# Patient Record
Sex: Male | Born: 1935 | Race: White | Hispanic: No | Marital: Married | Smoking: Never smoker
Health system: Southern US, Community
[De-identification: ages and names within clinical notes are randomized; demographics above are authoritative.]

## PROBLEM LIST (undated history)

## (undated) DIAGNOSIS — F0390 Unspecified dementia without behavioral disturbance: Secondary | ICD-10-CM

## (undated) DIAGNOSIS — E669 Obesity, unspecified: Secondary | ICD-10-CM

## (undated) DIAGNOSIS — E119 Type 2 diabetes mellitus without complications: Secondary | ICD-10-CM

## (undated) DIAGNOSIS — N400 Enlarged prostate without lower urinary tract symptoms: Secondary | ICD-10-CM

## (undated) DIAGNOSIS — E785 Hyperlipidemia, unspecified: Secondary | ICD-10-CM

## (undated) DIAGNOSIS — I10 Essential (primary) hypertension: Secondary | ICD-10-CM

## (undated) HISTORY — PX: CARPAL TUNNEL RELEASE: SHX101

## (undated) HISTORY — DX: Hyperlipidemia, unspecified: E78.5

## (undated) HISTORY — DX: Essential (primary) hypertension: I10

## (undated) HISTORY — PX: TONSILLECTOMY: SUR1361

## (undated) HISTORY — PX: CATARACT EXTRACTION: SUR2

---

## 2001-01-25 ENCOUNTER — Encounter: Payer: Self-pay | Admitting: Orthopedic Surgery

## 2001-01-27 ENCOUNTER — Inpatient Hospital Stay (HOSPITAL_COMMUNITY): Admission: RE | Admit: 2001-01-27 | Discharge: 2001-02-01 | Payer: Self-pay | Admitting: Orthopedic Surgery

## 2001-01-28 ENCOUNTER — Encounter: Payer: Self-pay | Admitting: Orthopedic Surgery

## 2001-01-29 ENCOUNTER — Encounter: Payer: Self-pay | Admitting: Orthopedic Surgery

## 2002-08-25 ENCOUNTER — Ambulatory Visit (HOSPITAL_COMMUNITY): Admission: RE | Admit: 2002-08-25 | Discharge: 2002-08-25 | Payer: Self-pay | Admitting: Gastroenterology

## 2003-03-18 HISTORY — PX: TOTAL KNEE ARTHROPLASTY: SHX125

## 2004-05-20 ENCOUNTER — Encounter: Admission: RE | Admit: 2004-05-20 | Discharge: 2004-08-18 | Payer: Self-pay | Admitting: Family Medicine

## 2006-03-20 ENCOUNTER — Ambulatory Visit (HOSPITAL_BASED_OUTPATIENT_CLINIC_OR_DEPARTMENT_OTHER): Admission: RE | Admit: 2006-03-20 | Discharge: 2006-03-20 | Payer: Self-pay | Admitting: Orthopedic Surgery

## 2009-06-15 ENCOUNTER — Ambulatory Visit (HOSPITAL_BASED_OUTPATIENT_CLINIC_OR_DEPARTMENT_OTHER): Admission: RE | Admit: 2009-06-15 | Discharge: 2009-06-15 | Payer: Self-pay | Admitting: Orthopedic Surgery

## 2009-07-19 ENCOUNTER — Inpatient Hospital Stay (HOSPITAL_COMMUNITY): Admission: EM | Admit: 2009-07-19 | Discharge: 2009-07-20 | Payer: Self-pay | Admitting: Emergency Medicine

## 2009-07-19 ENCOUNTER — Ambulatory Visit: Payer: Self-pay | Admitting: Cardiology

## 2009-07-20 ENCOUNTER — Ambulatory Visit: Payer: Self-pay | Admitting: Vascular Surgery

## 2009-07-20 ENCOUNTER — Encounter (INDEPENDENT_AMBULATORY_CARE_PROVIDER_SITE_OTHER): Payer: Self-pay | Admitting: Internal Medicine

## 2010-06-04 LAB — BRAIN NATRIURETIC PEPTIDE: Pro B Natriuretic peptide (BNP): <30 pg/mL (ref 0.0–100.0)

## 2010-06-04 LAB — CBC
MCHC: 32.8 g/dL (ref 30.0–36.0)
MCHC: 33 g/dL (ref 30.0–36.0)
MCV: 90.1 fL (ref 78.0–100.0)
Platelets: 244 10*3/uL (ref 150–400)
Platelets: 258 10*3/uL (ref 150–400)
RBC: 4.45 MIL/uL (ref 4.22–5.81)
RDW: 14.3 % (ref 11.5–15.5)
RDW: 15 % (ref 11.5–15.5)
WBC: 10.4 10*3/uL (ref 4.0–10.5)

## 2010-06-04 LAB — DIFFERENTIAL
Basophils Relative: 0 % (ref 0–1)
Eosinophils Absolute: 0.1 10*3/uL (ref 0.0–0.7)
Eosinophils Relative: 1 % (ref 0–5)

## 2010-06-04 LAB — HEMOGLOBIN A1C: Hgb A1c MFr Bld: 6.2 % — ABNORMAL HIGH (ref <5.7)

## 2010-06-04 LAB — URINE CULTURE: Colony Count: 9000

## 2010-06-04 LAB — BASIC METABOLIC PANEL
BUN: 23 mg/dL (ref 6–23)
Calcium: 9.1 mg/dL (ref 8.4–10.5)
GFR calc non Af Amer: >60 mL/min (ref >60)
Potassium: 3.7 mEq/L (ref 3.5–5.1)
Sodium: 138 mEq/L (ref 135–145)

## 2010-06-04 LAB — URINALYSIS, MICROSCOPIC ONLY
Glucose, UA: NEGATIVE mg/dL
Ketones, ur: NEGATIVE mg/dL
Leukocytes, UA: NEGATIVE
Nitrite: NEGATIVE
Specific Gravity, Urine: 1.022 (ref 1.005–1.030)
Urobilinogen, UA: 1 mg/dL (ref 0.0–1.0)

## 2010-06-04 LAB — CK TOTAL AND CKMB (NOT AT ARMC)
CK, MB: 13.6 ng/mL (ref 0.3–4.0)
Relative Index: 4.5 — ABNORMAL HIGH (ref 0.0–2.5)
Total CK: 300 U/L — ABNORMAL HIGH (ref 7–232)

## 2010-06-04 LAB — COMPREHENSIVE METABOLIC PANEL
Alkaline Phosphatase: 57 U/L (ref 39–117)
CO2: 23 mEq/L (ref 19–32)
Chloride: 103 mEq/L (ref 96–112)
GFR calc non Af Amer: 51 mL/min — ABNORMAL LOW (ref >60)
Glucose, Bld: 140 mg/dL — ABNORMAL HIGH (ref 70–99)
Potassium: 4.3 mEq/L (ref 3.5–5.1)
Sodium: 137 mEq/L (ref 135–145)
Total Protein: 7.6 g/dL (ref 6.0–8.3)

## 2010-06-04 LAB — T4, FREE: Free T4: 1.16 ng/dL (ref 0.80–1.80)

## 2010-06-04 LAB — D-DIMER, QUANTITATIVE: D-Dimer, Quant: 0.68 ug/mL-FEU — ABNORMAL HIGH (ref 0.00–0.48)

## 2010-06-04 LAB — CARDIAC PANEL(CRET KIN+CKTOT+MB+TROPI)
Relative Index: 4 — ABNORMAL HIGH (ref 0.0–2.5)
Troponin I: 0.01 ng/mL (ref 0.00–0.06)
Troponin I: 0.02 ng/mL (ref 0.00–0.06)

## 2010-06-04 LAB — LIPID PANEL
Cholesterol: 159 mg/dL (ref 0–200)
LDL Cholesterol: 114 mg/dL — ABNORMAL HIGH (ref 0–99)
Triglycerides: 100 mg/dL (ref <150)

## 2010-06-04 LAB — PROTIME-INR
INR: 1.07 (ref 0.00–1.49)
Prothrombin Time: 13.8 seconds (ref 11.6–15.2)

## 2010-06-05 LAB — POCT HEMOGLOBIN-HEMACUE: Hemoglobin: 13.9 g/dL (ref 13.0–17.0)

## 2010-06-09 LAB — BASIC METABOLIC PANEL
CO2: 25 mEq/L (ref 19–32)
Calcium: 9 mg/dL (ref 8.4–10.5)
Chloride: 109 mEq/L (ref 96–112)
Creatinine, Ser: 0.91 mg/dL (ref 0.4–1.5)
Sodium: 140 mEq/L (ref 135–145)

## 2010-08-02 NOTE — Op Note (Signed)
Daryl Salas, Daryl Salas            ACCOUNT NO.:  [id number]   MEDICAL RECORD NO.:  [id number]          PATIENT TYPE:  AMB   LOCATION:  DSC                          FACILITY:  [facility]   PHYSICIAN:  Katy Fitch. Sypher, M.D. 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   OPERATION:  1. Avulsion of left thumb nail plate.  2. Incision and drainage of left nail wall and apical abscess with      excisional debridement of necrotic tissue.   SURGEON:  Katy Fitch. Sypher, M.D.   ASSISTANT:  None.   ANESTHESIA:  2% lidocaine metacarpal head level block, left thumb, no  supplemental sedation was provided.  This was performed in the minor  operating room at [facility].   INDICATIONS:  Smitty Ackerley is a 77 year old right hand dominant  gentleman referred through the courtesy of Dr. Henrine Screws for  evaluation and management of a left thumb necrotizing infection.  Mr.  Hulsebus was evaluated by Dr. Abigail Miyamoto at [facility] [place] and  was noted to have a necrotizing infection.  He was placed on oral  doxycycline and advised to seek a hand surgery consult.  Mr. Bonenberger  was seen on March 20, 2006.  At that time, he was noted to have a  necrotizing thumb pulp, nail fold and subungual infection.  He underwent  debridement of his wound in the office and was noted have a subungual  collection of purulent material.  He had noted that since beginning is  antibiotic, his pain had improved somewhat.  We advised Mr. Postema to  proceed with incision and drainage with nail plate removal and  excisional debridement of his necrotic tissues along the ulnar nail  wall.  He is brought to the minor  operating room at this time.   PROCEDURE:  Batu Cassin is brought to the operating room and placed  in the supine position on the operating table.  Following placement of a  2% lidocaine metacarpal head level block, adequate anesthesia was  achieved.  The left hand and thumb were prepped with Betadine soap  solution and sterilely draped.  When anesthesia was assured, we  proceeded without a tourniquet.  The radial 6 mm of nail plate was  resected after undermining with a Therapist, nutritional and incision with a  pair of tenotomy scissors.  The nail was avulsed from the ulnar nail  wall with recovery of purulent material from beneath the nail and along  the ulnar nail wall.  A microcuret was used to remove all necrotic  tissues.  The areas of epidermal lysis were unroofed.  A complete  excisional debridement of all devitalized tissue was accomplished.  The  thumb was then dressed with Xeroflow sterile gauze and Coban.   Mr. Geoghegan will begin Septra DS one p.o. b.i.d. x7 days and will  continue with his doxycycline 100 mg p.o. b.i.d.  He will also use  Tylenol with Codeine #3, 1-2 tablets p.o. q.4-6h. p.r.n. pain.  Mr.  Gilkeson will return to our office for follow up in three days for a  dressing change and wound examination.  He will contact us p.r.n. fever  or other problems with his medication.      Katy Fitch Sypher, M.D.  Electronically Signed     RVS/MEDQ  D:  03/20/2006  T:  03/20/2006  Job:  [id number]   cc:   Chales Salmon. Abigail Miyamoto, M.D.

## 2010-08-02 NOTE — Discharge Summary (Signed)
[place]  Patient:    Daryl Salas, Daryl Salas [facility] Visit Number: [id number] MRN: [id number]          Service Type: SUR Location: 5000 5027 [id number] Attending Physician:  Marlowe Kays Page Dictated by:   Dorie Rank, P.A. Admit Date:  01/27/2001 Discharge Date: 02/01/2001                             Discharge Summary  ADMISSION DIAGNOSES: 1. End-stage osteoarthritis to the left knee. 2. Hypertension.  DISCHARGE DIAGNOSES: 1. Osteoarthritis of the left knee, status post left total knee arthroplasty. 2. Hypertension. 3. Tachycardia while in the hospital, resolved prior to discharge.  PROCEDURE:  On January 27, 2001, the patient underwent a left total knee arthroplasty using the Osteon[employer] system to the left.  Surgeon Dr. Marlowe Kays, assistant Dr. Jene Every, anesthesia general.  No complications.  CONSULTATION:  Selina Cooley, M.D. of [facility].  HISTORY OF PRESENT ILLNESS:  This is a pleasant, 77 year old male who has had an ongoing history of left knee pain for quite some time now.  It was interfering with his activities of daily living and has failed conservative management such as NSAIDs and physical therapy.  Radiographs in the office showed severe, end-stage osteoarthritis.  It was felt he would benefit from undergoing a left total knee arthroplasty.  The risks and benefits as well as the procedure were discussed with the patient and he agreed to proceed.  HOSPITAL COURSE:  The patient underwent the above-stated surgery on January 27, 2001, and tolerated the procedure well.  While in the operating room, the incision was dressed in a sterile fashion.  It was clean, dry and intact on postop day #1.  This was changed on postop day #2 and daily thereafter and found to be free of any erythema or drainage.  Hemovac drain was placed while in the operating room and this was discontinued on postop day #1 without difficulty.  While  in the operating room, an IV was started as well as a Foley catheter was inserted.  The Foley was discontinued on postop day #2 and he voided well throughout the remainder of his hospital stay.  Postoperatively, the patient was placed on Coumadin for DVT prophylaxis and this was monitored through the pharmacy throughout the remainder of his hospital stay.  A regular diet was advanced as tolerated.  He had good p.o. intake as well as BMs while in the hospital.  IV Ancef was used for postop infection control.  His home medications were resumed with Norvasc, triamterene and hydrochlorothiazide. Initially, he utilized a PCA of morphine.  This was discontinued on postop day #2.  His pain was adequately controlled on p.o. Percocet throughout the remainder of his hospital stay.  He also utilized knee high TED hose for DVT prophylaxis while in the hospital.  He utilized a CPM machine and this was advanced as tolerated.  Physical therapy worked with the patient for ambulation and total joint protocol.  He was 50% partial weightbearing to the left lower extremity.  He progressed well and was able to maintain his weightbearing status.  Hemoglobin and hematocrit were checked daily x 3 days and found to be stable postoperatively.  While in the hospital on postop day #1, he did have some tachycardia.  An EKG revealed a rate of 131 and no ST changes.  He was having no chest pain and his lungs were clear to auscultation.  A consult was obtained.  Dr. Evonnie Pat saw and evaluated the patient.  Serial EKGs were obtained and showed no changes.  Chest x-ray revealed no active disease.  A CT scan of the chest was obtained to rule out PE and was negative.  It was felt that his tachycardia was multifactorial related to stress, fever and anemia.  He did run a mild fever in the hospital and utilized incentive spirometer and it was felt this was likely due to atelectasis.  On February 01, 2001, he was felt to be stable  for discharge from a medical and orthopedic standpoint.  LABORATORY DATA AND X-RAY FINDINGS:  H&H on January 25, 2001, 13.0 and 37.6. On January 29, 2001, hemoglobin 10.4 and hematocrit 30.0.  On November 16, hemoglobin 9.8 and hematocrit 28.5.  Blood gasses on January 28, 2001, revealed a pO2 of 63.1, bicarb 26.4, otherwise within normal limits.  PT/INR on January 25, 2001, within normal limits.  On January 31, 2001, on Coumadin therapy, 15.9 and 1.4 respectively.  On February 01, 2001, PT 15.6, INR 1.3. BMET on January 25, 2001, normal other than glucose 125.  On January 30, 2001, potassium 3.4, glucose 141, otherwise normal.  Liver function studies on January 25, 2001, revealed an ALT of 44, otherwise normal.  UA on November 11 and January 31, 2001, negative.  Blood type on January 25, 2001, O positive. Blood cultures from January 31, 2001, and on February 01, 2001, were fine and showed no growth x 1 day.  EKG tracings from January 28, 2001, revealed sinus tachycardia, otherwise normal confirmed by Dr. Nona Dell.  On January 29, 2001, tracings showed sinus tachycardia with no changes since November 14, confirmed by Dr. Verdis Prime.  On January 30, 2001, EKG showed sinus tachycardia with no changes confirmed by Dr. Valera Castle.  CT of chest on January 29, 2001, revealed right lower lobe atelectasis with right pleural effusion, superimposed infiltrate or even pulmonary infarct is not excluded.  However, no gross pulmonary emboli noted or cardiomegaly.  CT of the lower extremity, for evaluation of DVT on January 29, 2001, showed no evidence for deep venous thrombosis.  DISPOSITION:  The patient was discharged to home with home health PT and Coumadin therapy with [place].  ACTIVITY:  50% partial weightbearing to the left lower extremity.  SPECIAL INSTRUCTIONS:  Daily dressing changes.  Shower on postop day #5. Incentive spirometry at home.  DISCHARGE  MEDICATIONS: 1. Tylenol p.r.n. fever.  If greater than 101 call the office. 2. Percocet. 3. Robaxin. 4. Coumadin per pharmacy.  5. Resume home medications.  DIET:  Low sodium diet. Dictated by:   Dorie Rank, P.A. Attending Physician:  Joaquin Courts DD:  03/03/01 TD:  03/04/01 Job: [id number]

## 2010-08-02 NOTE — H&P (Signed)
[facility]  Patient:    Daryl Salas, Daryl Salas Visit Number: [id number] MRN: [id number]          Service Type: Attending:  Illene Labrador. Aplington, M.D. Dictated by:   Sammuel Cooper. Mahar, P.A. Adm. Date:  01/27/01                           History and Physical  DATE OF BIRTH:  03/14/1934  CHIEF COMPLAINT:  Left knee pain.  HISTORY OF PRESENT ILLNESS:  The patient has a longstanding history of left knee pain and disability.  He has failed conservative management, such as NSAIDs, rest, and physical therapy to the point that it is causing him daily pain and effecting his activities of daily living and quality of life.  It was felt that his best course of management at this point would be to proceed with a left total knee replacement.  PAST MEDICAL HISTORY:  Significant for hypertension, which is medically controlled.  PAST SURGICAL HISTORY:  Tonsillectomy as a child and bilateral cataract surgery in 2000.  ALLERGIES:  No known drug allergies.  MEDICATIONS: 1. Norvasc 10 mg q.d. 2. Triamterene/hydrochlorothiazide 37.5/25 mg one tablet p.o. q.d.  FAMILY MEDICAL HISTORY:  Mother deceased at age 80 with a history of diabetes. Father deceased at age 56 with colon cancer and arthritis.  REVIEW OF SYSTEMS:  The patient denies any fever, chills, night sweats, or bleeding tendencies.  CNS:  Denies blurred vision, double vision, seizure, headache, or paralysis.  Pulmonary:  Denies shortness of breath, productive cough, and hemoptysis.  Cardiovascular:  Denies chest pain, angina, orthopnea, or claudication.  GI:  Denies nausea, vomiting, constipation, diarrhea, melena, or bloody stool.  GU:  Denies dysuria, hematuria, or discharge. Musculoskeletal:  Denies paresthesias, numbness, or weakness in his extremities.  PHYSICAL EXAMINATION:  VITAL SIGNS:  The blood pressure is 150/68, respirations are 20 and unlabored, and the pulse if 92 and regular.  GENERAL  APPEARANCE:  The patient is a 77 year old white male who is alert and oriented and in no acute distress.  He is very pleasant and cooperative to examination.  He is well nourished and well groomed.  He walks with an antalgic gait.  HEENT:  Pupils are equal, round, and reactive to light.  The nares are patent bilaterally.  The pharynx is clear without any erythema or exudate.  NECK:  Soft and supple to palpation.  No bruits appreciated.  No thyromegaly noted.  No adenopathy.  CHEST:  Clear to auscultation bilaterally.  No rales, rhonchi, stridor, wheezes, or friction rubs.  BREASTS:  Not pertinent and not performed.  HEART:  S1 and S2 regular rate and rhythm.  No murmurs, rubs, or gallops noted.  ABDOMEN:  Positive bowel sounds throughout.  An obese abdomen, nontender and nondistended, with no organomegaly noted.  GENITOURINARY:  Not pertinent and not performed.  EXTREMITIES:  Knees with varus deformity bilaterally.  He is tender throughout medial and lateral joint lines on his left knee.  He does have varicose veins in his left lower extremity.  SKIN:  Intact without any lesions or rashes.  LABORATORY DATA AND X-RAYS:  There is bone-on-bone deformity on the left knee.  IMPRESSION: 1. End-stage osteoarthritis, left knee. 2. Hypertension.  PLAN:  Admit to [facility] [facility] [employer] [facility] on January 27, 2001, for a left total knee replacement by Illene Labrador. Aplington, M.D.  The patient has obtained preoperative medical clearance from [facility].  His primary care practitioner is Vikki Ports, M.D., and has given him clearance to proceed with his knee replacement surgery. Dictated by:   Sammuel Cooper. Mahar, P.A. Attending:  Illene Labrador. Aplington, M.D. DD:  01/18/01 TD:  01/19/01 Job: [id number]

## 2010-08-02 NOTE — Op Note (Signed)
   NAMEFOSTER, FRERICKS                        ACCOUNT NO.:  [id number]   MEDICAL RECORD NO.:  [id number]                   PATIENT TYPE:  AMB   LOCATION:  ENDO                                 FACILITY:  [facility]   PHYSICIAN:  Graylin Shiver, M.D.                DATE OF BIRTH:  Jan 25, 1934   DATE OF PROCEDURE:  08/25/2002  DATE OF DISCHARGE:                                 OPERATIVE REPORT   PROCEDURE:  Colonoscopy.   INDICATION FOR PROCEDURE:  Screening.   Informed consent was obtained.   PREMEDICATION:  Fentanyl 60 mcg IV, Versed 6 mg IV.   PROCEDURE:  With the patient in the left lateral decubitus position, a  rectal exam was performed and no masses were felt.  The Olympus colonoscope  was inserted into the rectum and advanced around the colon to the cecum.  Cecal landmarks were identified.  The cecum and ascending colon were normal.  The transverse colon was normal.  The descending colon, sigmoid, and rectum  were normal.  He tolerated the procedure well without complications.   IMPRESSION:  Normal colonoscopy to the cecum.                                               Graylin Shiver, M.D.    Germain Osgood  D:  08/25/2002  T:  08/25/2002  Job:  [id number]   cc:   Vikki Ports, M.D.  [address]  [place]  [place] [address]  Fax: [phone]

## 2010-08-02 NOTE — Op Note (Signed)
[place]  Patient:    Daryl Salas [facility] Visit Number: [id number] MRN: [id number]          Service Type: SUR Location: 5000 5027 01 Attending Physician:  Marlowe Kays Page Dictated by:   Illene Labrador. Aplington, M.D. Proc. Date: 01/27/01 Admit Date:  01/27/2001                             Operative Report  PREOPERATIVE DIAGNOSIS:  Osteoarthritis - left knee.  POSTOPERATIVE DIAGNOSIS:  Osteoarthritis - left knee.  OPERATION:  Osteonics total knee replacement - left.  SURGEON:  Illene Labrador. Aplington, M.D.  ASSISTANT:  Javier Docker, M.D.  ANESTHESIA:  General.  JUSTIFICATION FOR PROCEDURE:  He had significant pain and tricompartment arthritis with bone-on-bone abutment medially.  PROCEDURE:  Prophylactic antibiotics, satisfactory general anesthesia, Foley catheter inserted, left hip stabilizer, pneumatic tourniquet, and Surefoot applied.  The left leg was prepped with Duraprep from tourniquet to ankle and draped in a sterile field, Ioban employed.  A vertical midline incision down to the patellar mechanism with median peripatellar incision to open the joint. Large osteophytes from around the femur and the patella were removed with rongeur.  The patella was sized to a #30.  I freed up the patellar mechanism and also undermined the mediocollateral ligament and ______ off the proximal tibia.  The patella was then everted and the knee flexed.  The anterior portions of both menisci and the ACL were sacrificed and a 5/16th inch drill hole was made in the distal femur, followed by the canal finder and the axis aligning rod with the distal femoral jig placed set at 5 degrees for the left knee.  He had a small flexion contracture and accordingly, I elected to go ahead with a 12 mm distal femoral cut and this was made.  I then removed remnants of the menisci posteriorly and used the guide for the distal femur to measure for the distal femoral  prosthesis, which was found to be an eleven. The position lines for the distal femoral cutting jig were also made.  The chamfering distal femoral cutting jig was then applied and anterior and posterior cuts and posterior and anterior chamferings were then made.  Then, returned the the tibia, where an leveling cut was made and this was sized at a #11 likewise.  I elected to save the posterior cruciate ligament because it was in good condition and the knee was not so tight that we had to sacrifice it.  I then, using the tibial base plate, made an intramedullary drill hole, followed by the step-cut drill and the canal finder.  I then placed intramedullary rod with cutting jig set at 5 degrees for posterior cut and 4 mm cut from the ______ medial tibial plateau, aligning it with an extramedullary rod splitting the bimalleolar  distance.  My cut was made. Then, removed remnants of the posterior femur using a laminar spreader, osteotome and curved Crego.  The femoral guide for creating the patellar groove was then placed and the groove created.  I then went through a trial reduction and found the 10 mm spacer fit nicely with excellent medial and lateral stability and no lift-off, and zero to minimal recurvatum.  Using the external alignment rod once again I realigned up the scribe lines of the tibial base plate based on splitting the bimalleolar distance.  While the knee was in extension we then went ahead and  used the 10 cc mm recessed cutting jig to cut for the patella then inserted the 3-hole guide and made the three fixation holes.  The trial patella was then placed and excess bone removed from around the perimeter.  Then, returned to the tibia, where the tibia base plate #16 was placed with 3-pin fixation using the previously marked scribe lines and the tripod apparatus was then used to ream up to an 11 cemented for the tibia keel.  Once all this preparation had been completed, the knee  was waterpicked while the methyl methacrylate was being mixed and it was then applied through a glue gun first to the tibia, where the tibial prosthesis was then placed and packed tightly and excess methyl methacrylate removed.  The same was done for the femur, impacting it tightly and the knee was then held in extension while I glued in the patella using a patella holding clamp.  When the methacrylate had hardened, we inspected all components, removed small portions of methyl methacrylate.  It was clear that the 10 mm spacer was the best fit, so we went ahead and used the final 10 mm polyethylene spacer and checked the knee once again for stability and motion, which were all excellent.  He required minimal lateral release.  It was my impression initially a Hemovac was placed and closure performed with multiple interrupted #1 Vicryl in the synovium and capsule distally in two layers, and two layers in the quadriceps proximally.  At this point, I checked for motion and there was a clunking of the patella on flexion and extension.  We were unsure as to what was causing this; I did a wide patellar release laterally and the clunking was only slightly improved.  I then opened the synovium laterally and found that and moved the Hemovac to a lateral compartment of the knee joint and the clunking completely disappeared, indicating that this was due to entrapment of the Hemovac.  Once I was sure there was no mechanical problem with the prosthesis, we completed the closure with interrupted #1 Vicryl, 0 Vicryl, and 2-0 Vicryl going from deep to superficial in the subcutaneous tissue and staples in the skin.  Betadine, Adaptic, dry sterile dressing was applied, the tourniquet was released with two hours of tourniquet time just prior to completing the closure.  Betadine, Adaptic, dry sterile dressing were applied.  He tolerated the procedure and was taken to recovery in satisfactory condition with no  known complications. Estimated blood loss was zero and no blood replacement.  FINAL COMPONENTS:  Eleven femur and tibia, Scorpio cruciate retaining, 10 mm tibial spacer, and 30 mm patella.  Dictated by:   Illene Labrador. Aplington, M.D. Attending Physician:  Joaquin Courts DD:  01/27/01 TD:  01/27/01 Job: [id number]

## 2012-08-11 ENCOUNTER — Ambulatory Visit (INDEPENDENT_AMBULATORY_CARE_PROVIDER_SITE_OTHER): Payer: Medicare Other | Admitting: Surgery

## 2012-08-11 ENCOUNTER — Encounter (INDEPENDENT_AMBULATORY_CARE_PROVIDER_SITE_OTHER): Payer: Self-pay | Admitting: Surgery

## 2012-08-11 VITALS — BP 120/46 | HR 95 | Temp 96.2°F | Ht 71.5 in | Wt 272.8 lb

## 2012-08-11 DIAGNOSIS — L02419 Cutaneous abscess of limb, unspecified: Secondary | ICD-10-CM

## 2012-08-11 DIAGNOSIS — L02416 Cutaneous abscess of left lower limb: Secondary | ICD-10-CM

## 2012-08-11 NOTE — Patient Instructions (Signed)
Change the dressing daily starting Friday using a sterile gauze. You may shower starting Sunday. Continue your antibiotic ans see me again next week

## 2012-08-11 NOTE — Progress Notes (Signed)
NAME: Daryl Salas       DOB: 11-21-1933           DATE: 08/11/2012       ZOX:[id number]  CC:  Chief Complaint  Patient presents with  . Abscess    Left thigh    HPI: this patient comes to the urgent office with abscess of left anterior thigh that has been present several days and draining purulent material. It is reportedly growing staph, not MRSA. He has been on doxycycline, just changed by his primary care physician today to Keflex. Surgical consultation was required this year he has not improved.  EXAM: Vital signs: BP 120/46  Pulse 95  Temp(Src) 96.2 F (35.7 C) (Temporal)  Ht 5' 11.5" (1.816 m)  Wt 272 lb 12.8 oz (123.741 kg)  BMI 37.52 kg/m2  SpO2 97%  General: Patient alert, oriented, NAD  On the left anterior thigh this area is 1.5 x 4 cm red with multiple punctate open areas and draining purulent material and a  1 CM  area of erythema around it. IMP: superficial left anterior thigh abscess  PLAN: I've recommended that we open and drain this area. I discussed the indications and procedure and he is agreeable to having this done today under local anesthesia.  Procedure note: The area of abscess is prepped with Betadine and anesthetized with about  Eight cc of 1% Xylocaine with epi.I excised the overlyiing skin and there appeared to be an sebaceous cyst that had become infected.I packed it with a 2x2. He will begin dressing changes Friday and folow up with me next week   Shannyn Jankowiak J 08/11/2012

## 2012-08-18 ENCOUNTER — Ambulatory Visit (INDEPENDENT_AMBULATORY_CARE_PROVIDER_SITE_OTHER): Payer: Medicare Other | Admitting: Surgery

## 2012-08-18 ENCOUNTER — Encounter (INDEPENDENT_AMBULATORY_CARE_PROVIDER_SITE_OTHER): Payer: Self-pay | Admitting: Surgery

## 2012-08-18 VITALS — BP 124/80 | HR 82 | Temp 98.2°F | Resp 16 | Ht 71.5 in | Wt 274.0 lb

## 2012-08-18 DIAGNOSIS — Z09 Encounter for follow-up examination after completed treatment for conditions other than malignant neoplasm: Secondary | ICD-10-CM

## 2012-08-18 NOTE — Progress Notes (Signed)
He comes back for postop visit after drainage of a superficial left thigh abscess which arose in a sebaceous cyst. They're doing daily dressing changes and he feels the area is doing well. He is not having much pain.  Exam: Wounds healing nicely with some granulation tissue. There is a small amount of sebaceous material at the base which I debrided out today.  Impression: Resolving infected sebaceous cyst  Plan: He'll continue local dressing changes and I will see him next week. When he completes his course of antibiotic he will not need to resume

## 2012-08-18 NOTE — Patient Instructions (Signed)
Continue dressing changes as you have been doing. See me again in about a week.

## 2012-08-27 ENCOUNTER — Ambulatory Visit (INDEPENDENT_AMBULATORY_CARE_PROVIDER_SITE_OTHER): Payer: Medicare Other | Admitting: Surgery

## 2012-08-27 ENCOUNTER — Encounter (INDEPENDENT_AMBULATORY_CARE_PROVIDER_SITE_OTHER): Payer: Self-pay | Admitting: Surgery

## 2012-08-27 VITALS — BP 120/64 | HR 64 | Temp 97.8°F | Resp 18 | Ht 71.5 in | Wt 274.6 lb

## 2012-08-27 DIAGNOSIS — Z09 Encounter for follow-up examination after completed treatment for conditions other than malignant neoplasm: Secondary | ICD-10-CM

## 2012-08-27 NOTE — Progress Notes (Signed)
He comes back for postop visit after drainage of a superficial left thigh abscess which arose in a sebaceous cyst. They're doing daily dressing changes and he feels the area is doing well. He is not having much pain.  Exam: Wounds healing nicely with some granulation tissue. There is still a  small amount of sebaceous material at the base which I debrided out today.  Impression: Resolving infected sebaceous cyst  Plan: He'll continue local dressing changes and I will see him next week.

## 2012-09-03 ENCOUNTER — Ambulatory Visit (INDEPENDENT_AMBULATORY_CARE_PROVIDER_SITE_OTHER): Payer: Medicare Other | Admitting: Surgery

## 2012-09-03 ENCOUNTER — Encounter (INDEPENDENT_AMBULATORY_CARE_PROVIDER_SITE_OTHER): Payer: Self-pay | Admitting: Surgery

## 2012-09-03 VITALS — BP 138/60 | HR 70 | Temp 97.7°F | Resp 17 | Ht 71.5 in | Wt 271.6 lb

## 2012-09-03 DIAGNOSIS — Z09 Encounter for follow-up examination after completed treatment for conditions other than malignant neoplasm: Secondary | ICD-10-CM

## 2012-09-03 NOTE — Progress Notes (Signed)
He comes back for postop visit after drainage of a superficial left thigh abscess which arose in a sebaceous cyst. They're doing daily dressing changes and he feels the area is doing well. He is not having any pain.  Exam: Wounds healing nicely and should close in over the next week.  Impression: Resolving infected sebaceous cyst  Plan: RTC PRN.

## 2012-09-03 NOTE — Patient Instructions (Signed)
We will see you again on an as needed basis. Please call the office at [phone] if you have any questions or concerns. Thank you for allowing us to take care of you.  

## 2012-10-20 ENCOUNTER — Other Ambulatory Visit: Payer: Self-pay

## 2013-01-20 ENCOUNTER — Other Ambulatory Visit: Payer: Self-pay

## 2013-12-15 ENCOUNTER — Other Ambulatory Visit: Payer: Self-pay | Admitting: Family Medicine

## 2013-12-15 DIAGNOSIS — R413 Other amnesia: Secondary | ICD-10-CM

## 2013-12-21 ENCOUNTER — Ambulatory Visit
Admission: RE | Admit: 2013-12-21 | Discharge: 2013-12-21 | Disposition: A | Discharge status: Home / Self Care | Payer: Medicare Other | Source: Ambulatory Visit | Attending: Family Medicine | Admitting: Family Medicine

## 2013-12-21 DIAGNOSIS — R413 Other amnesia: Secondary | ICD-10-CM

## 2013-12-21 MED ORDER — IOHEXOL 300 MG/ML  SOLN
75.0000 mL | Freq: Once | INTRAMUSCULAR | Status: AC | PRN
  Administered 2013-12-21: 75 mL via INTRAVENOUS

## 2013-12-30 ENCOUNTER — Other Ambulatory Visit: Payer: Self-pay

## 2014-09-11 ENCOUNTER — Other Ambulatory Visit: Payer: Self-pay

## 2015-12-24 ENCOUNTER — Emergency Department (HOSPITAL_COMMUNITY): Payer: PPO

## 2015-12-24 ENCOUNTER — Encounter (HOSPITAL_COMMUNITY): Payer: Self-pay | Admitting: *Deleted

## 2015-12-24 ENCOUNTER — Inpatient Hospital Stay (HOSPITAL_COMMUNITY)
Admission: EM | Admit: 2015-12-24 | Discharge: 2015-12-29 | DRG: 871 | Disposition: A | Discharge status: Home Health | Payer: PPO | Attending: Internal Medicine | Admitting: Internal Medicine

## 2015-12-24 DIAGNOSIS — Z79899 Other long term (current) drug therapy: Secondary | ICD-10-CM | POA: Diagnosis not present

## 2015-12-24 DIAGNOSIS — R319 Hematuria, unspecified: Secondary | ICD-10-CM | POA: Diagnosis present

## 2015-12-24 DIAGNOSIS — N12 Tubulo-interstitial nephritis, not specified as acute or chronic: Secondary | ICD-10-CM | POA: Diagnosis present

## 2015-12-24 DIAGNOSIS — I5033 Acute on chronic diastolic (congestive) heart failure: Secondary | ICD-10-CM | POA: Diagnosis present

## 2015-12-24 DIAGNOSIS — R7989 Other specified abnormal findings of blood chemistry: Secondary | ICD-10-CM

## 2015-12-24 DIAGNOSIS — I4891 Unspecified atrial fibrillation: Secondary | ICD-10-CM | POA: Diagnosis present

## 2015-12-24 DIAGNOSIS — J9601 Acute respiratory failure with hypoxia: Secondary | ICD-10-CM | POA: Diagnosis present

## 2015-12-24 DIAGNOSIS — N401 Enlarged prostate with lower urinary tract symptoms: Secondary | ICD-10-CM

## 2015-12-24 DIAGNOSIS — F0390 Unspecified dementia without behavioral disturbance: Secondary | ICD-10-CM | POA: Diagnosis present

## 2015-12-24 DIAGNOSIS — I248 Other forms of acute ischemic heart disease: Secondary | ICD-10-CM | POA: Diagnosis present

## 2015-12-24 DIAGNOSIS — B9689 Other specified bacterial agents as the cause of diseases classified elsewhere: Secondary | ICD-10-CM | POA: Diagnosis present

## 2015-12-24 DIAGNOSIS — F0391 Unspecified dementia with behavioral disturbance: Secondary | ICD-10-CM | POA: Diagnosis present

## 2015-12-24 DIAGNOSIS — N10 Acute tubulo-interstitial nephritis: Secondary | ICD-10-CM | POA: Diagnosis not present

## 2015-12-24 DIAGNOSIS — I110 Hypertensive heart disease with heart failure: Secondary | ICD-10-CM | POA: Diagnosis present

## 2015-12-24 DIAGNOSIS — E785 Hyperlipidemia, unspecified: Secondary | ICD-10-CM | POA: Diagnosis present

## 2015-12-24 DIAGNOSIS — Z6837 Body mass index (BMI) 37.0-37.9, adult: Secondary | ICD-10-CM

## 2015-12-24 DIAGNOSIS — N320 Bladder-neck obstruction: Secondary | ICD-10-CM | POA: Diagnosis present

## 2015-12-24 DIAGNOSIS — A419 Sepsis, unspecified organism: Secondary | ICD-10-CM | POA: Diagnosis not present

## 2015-12-24 DIAGNOSIS — N419 Inflammatory disease of prostate, unspecified: Secondary | ICD-10-CM | POA: Diagnosis present

## 2015-12-24 DIAGNOSIS — E119 Type 2 diabetes mellitus without complications: Secondary | ICD-10-CM | POA: Diagnosis present

## 2015-12-24 DIAGNOSIS — N3091 Cystitis, unspecified with hematuria: Secondary | ICD-10-CM | POA: Diagnosis present

## 2015-12-24 DIAGNOSIS — N400 Enlarged prostate without lower urinary tract symptoms: Secondary | ICD-10-CM | POA: Diagnosis present

## 2015-12-24 DIAGNOSIS — H409 Unspecified glaucoma: Secondary | ICD-10-CM | POA: Diagnosis present

## 2015-12-24 DIAGNOSIS — N179 Acute kidney failure, unspecified: Secondary | ICD-10-CM | POA: Diagnosis present

## 2015-12-24 DIAGNOSIS — I5031 Acute diastolic (congestive) heart failure: Secondary | ICD-10-CM | POA: Diagnosis not present

## 2015-12-24 DIAGNOSIS — D72819 Decreased white blood cell count, unspecified: Secondary | ICD-10-CM | POA: Diagnosis present

## 2015-12-24 DIAGNOSIS — R0902 Hypoxemia: Secondary | ICD-10-CM

## 2015-12-24 DIAGNOSIS — R6521 Severe sepsis with septic shock: Secondary | ICD-10-CM | POA: Diagnosis present

## 2015-12-24 DIAGNOSIS — I2510 Atherosclerotic heart disease of native coronary artery without angina pectoris: Secondary | ICD-10-CM | POA: Diagnosis present

## 2015-12-24 DIAGNOSIS — R748 Abnormal levels of other serum enzymes: Secondary | ICD-10-CM | POA: Diagnosis not present

## 2015-12-24 DIAGNOSIS — R000 Tachycardia, unspecified: Secondary | ICD-10-CM | POA: Diagnosis present

## 2015-12-24 DIAGNOSIS — R310 Gross hematuria: Secondary | ICD-10-CM

## 2015-12-24 DIAGNOSIS — E669 Obesity, unspecified: Secondary | ICD-10-CM | POA: Diagnosis present

## 2015-12-24 DIAGNOSIS — G9340 Encephalopathy, unspecified: Secondary | ICD-10-CM | POA: Diagnosis present

## 2015-12-24 DIAGNOSIS — I5032 Chronic diastolic (congestive) heart failure: Secondary | ICD-10-CM | POA: Diagnosis present

## 2015-12-24 DIAGNOSIS — R778 Other specified abnormalities of plasma proteins: Secondary | ICD-10-CM

## 2015-12-24 DIAGNOSIS — Z7982 Long term (current) use of aspirin: Secondary | ICD-10-CM | POA: Diagnosis not present

## 2015-12-24 DIAGNOSIS — A4150 Gram-negative sepsis, unspecified: Secondary | ICD-10-CM | POA: Diagnosis present

## 2015-12-24 DIAGNOSIS — Z96652 Presence of left artificial knee joint: Secondary | ICD-10-CM | POA: Diagnosis present

## 2015-12-24 HISTORY — DX: Unspecified dementia, unspecified severity, without behavioral disturbance, psychotic disturbance, mood disturbance, and anxiety: F03.90

## 2015-12-24 HISTORY — DX: Type 2 diabetes mellitus without complications: E11.9

## 2015-12-24 HISTORY — DX: Benign prostatic hyperplasia without lower urinary tract symptoms: N40.0

## 2015-12-24 HISTORY — DX: Obesity, unspecified: E66.9

## 2015-12-24 LAB — I-STAT CG4 LACTIC ACID, ED
LACTIC ACID, VENOUS: 4.36 mmol/L — AB (ref 0.5–1.9)
Lactic Acid, Venous: 8.77 mmol/L (ref 0.5–1.9)

## 2015-12-24 LAB — CBC WITH DIFFERENTIAL/PLATELET
BASOS ABS: 0 10*3/uL (ref 0.0–0.1)
Basophils Relative: 1 %
EOS PCT: 2 %
Eosinophils Absolute: 0.1 10*3/uL (ref 0.0–0.7)
HCT: 47.3 % (ref 39.0–52.0)
HEMOGLOBIN: 15.1 g/dL (ref 13.0–17.0)
LYMPHS PCT: 16 %
Lymphs Abs: 0.3 10*3/uL — ABNORMAL LOW (ref 0.7–4.0)
MCH: 30 pg (ref 26.0–34.0)
MCHC: 31.9 g/dL (ref 30.0–36.0)
MCV: 94 fL (ref 78.0–100.0)
Monocytes Absolute: 0 10*3/uL — ABNORMAL LOW (ref 0.1–1.0)
Monocytes Relative: 1 %
NEUTROS ABS: 1.8 10*3/uL (ref 1.7–7.7)
NEUTROS PCT: 80 %
PLATELETS: 147 10*3/uL — AB (ref 150–400)
RBC: 5.03 MIL/uL (ref 4.22–5.81)
RDW: 13.5 % (ref 11.5–15.5)
WBC: 2.2 10*3/uL — AB (ref 4.0–10.5)

## 2015-12-24 LAB — URINALYSIS, ROUTINE W REFLEX MICROSCOPIC
Bilirubin Urine: NEGATIVE
GLUCOSE, UA: NEGATIVE mg/dL
Ketones, ur: NEGATIVE mg/dL
Nitrite: NEGATIVE
PROTEIN: 30 mg/dL — AB
SPECIFIC GRAVITY, URINE: 1.012 (ref 1.005–1.030)
pH: 6 (ref 5.0–8.0)

## 2015-12-24 LAB — URINE MICROSCOPIC-ADD ON

## 2015-12-24 LAB — COMPREHENSIVE METABOLIC PANEL
ALBUMIN: 4.4 g/dL (ref 3.5–5.0)
ALT: 26 U/L (ref 17–63)
AST: 49 U/L — AB (ref 15–41)
Alkaline Phosphatase: 56 U/L (ref 38–126)
Anion gap: 17 — ABNORMAL HIGH (ref 5–15)
BUN: 16 mg/dL (ref 6–20)
CHLORIDE: 105 mmol/L (ref 101–111)
CO2: 19 mmol/L — AB (ref 22–32)
CREATININE: 1.23 mg/dL (ref 0.61–1.24)
Calcium: 8.8 mg/dL — ABNORMAL LOW (ref 8.9–10.3)
GFR calc Af Amer: >60 mL/min (ref >60)
GFR calc non Af Amer: 54 mL/min — ABNORMAL LOW (ref >60)
GLUCOSE: 129 mg/dL — AB (ref 65–99)
POTASSIUM: 3.9 mmol/L (ref 3.5–5.1)
SODIUM: 141 mmol/L (ref 135–145)
Total Bilirubin: 1.9 mg/dL — ABNORMAL HIGH (ref 0.3–1.2)
Total Protein: 6.9 g/dL (ref 6.5–8.1)

## 2015-12-24 LAB — CBG MONITORING, ED: Glucose-Capillary: 115 mg/dL — ABNORMAL HIGH (ref 65–99)

## 2015-12-24 LAB — I-STAT BETA HCG BLOOD, ED (MC, WL, AP ONLY)

## 2015-12-24 MED ORDER — LORAZEPAM 2 MG/ML IJ SOLN
0.5000 mg | Freq: Once | INTRAMUSCULAR | Status: AC
  Administered 2015-12-24: 0.5 mg via INTRAVENOUS
  Filled 2015-12-24: qty 1

## 2015-12-24 MED ORDER — SIMVASTATIN 40 MG PO TABS
20.0000 mg | ORAL_TABLET | Freq: Every evening | ORAL | Status: DC
  Administered 2015-12-25 – 2015-12-28 (×4): 20 mg via ORAL
  Filled 2015-12-24 (×5): qty 1

## 2015-12-24 MED ORDER — DORZOLAMIDE HCL 2 % OP SOLN
1.0000 [drp] | Freq: Every day | OPHTHALMIC | Status: DC
  Administered 2015-12-26 – 2015-12-29 (×4): 1 [drp] via OPHTHALMIC
  Filled 2015-12-24: qty 10

## 2015-12-24 MED ORDER — BENAZEPRIL HCL 40 MG PO TABS
40.0000 mg | ORAL_TABLET | Freq: Every day | ORAL | Status: DC
  Filled 2015-12-24: qty 1

## 2015-12-24 MED ORDER — PIPERACILLIN-TAZOBACTAM 3.375 G IVPB 30 MIN
3.3750 g | Freq: Once | INTRAVENOUS | Status: AC
  Administered 2015-12-24: 3.375 g via INTRAVENOUS
  Filled 2015-12-24: qty 50

## 2015-12-24 MED ORDER — VANCOMYCIN HCL IN DEXTROSE 1-5 GM/200ML-% IV SOLN: 1000.0000 mg | Freq: Once | INTRAVENOUS | Status: DC

## 2015-12-24 MED ORDER — ONDANSETRON HCL 4 MG/2ML IJ SOLN: 4.0000 mg | Freq: Four times a day (QID) | INTRAMUSCULAR | Status: DC | PRN

## 2015-12-24 MED ORDER — TIMOLOL HEMIHYDRATE 0.5 % OP SOLN: 1.0000 [drp] | Freq: Two times a day (BID) | OPHTHALMIC | Status: DC

## 2015-12-24 MED ORDER — ADULT MULTIVITAMIN W/MINERALS CH
1.0000 | ORAL_TABLET | Freq: Every day | ORAL | Status: DC
  Administered 2015-12-26 – 2015-12-29 (×4): 1 via ORAL
  Filled 2015-12-24 (×4): qty 1

## 2015-12-24 MED ORDER — DONEPEZIL HCL 10 MG PO TABS
10.0000 mg | ORAL_TABLET | Freq: Every day | ORAL | Status: DC
  Administered 2015-12-26 – 2015-12-29 (×4): 10 mg via ORAL
  Filled 2015-12-24 (×5): qty 1

## 2015-12-24 MED ORDER — LORAZEPAM 2 MG/ML IJ SOLN
0.2500 mg | INTRAMUSCULAR | Status: DC | PRN
  Administered 2015-12-24 – 2015-12-25 (×2): 0.5 mg via INTRAVENOUS
  Filled 2015-12-24 (×2): qty 1

## 2015-12-24 MED ORDER — FUROSEMIDE 10 MG/ML IJ SOLN
40.0000 mg | INTRAMUSCULAR | Status: AC
  Administered 2015-12-24: 40 mg via INTRAVENOUS
  Filled 2015-12-24: qty 4

## 2015-12-24 MED ORDER — FINASTERIDE 5 MG PO TABS
5.0000 mg | ORAL_TABLET | Freq: Every day | ORAL | Status: DC
  Administered 2015-12-26 – 2015-12-29 (×4): 5 mg via ORAL
  Filled 2015-12-24 (×4): qty 1

## 2015-12-24 MED ORDER — SODIUM CHLORIDE 0.9 % IV BOLUS (SEPSIS)
1000.0000 mL | Freq: Once | INTRAVENOUS | Status: AC
  Administered 2015-12-24: 1000 mL via INTRAVENOUS

## 2015-12-24 MED ORDER — ONDANSETRON HCL 4 MG PO TABS: 4.0000 mg | ORAL_TABLET | Freq: Four times a day (QID) | ORAL | Status: DC | PRN

## 2015-12-24 MED ORDER — IPRATROPIUM-ALBUTEROL 0.5-2.5 (3) MG/3ML IN SOLN
3.0000 mL | RESPIRATORY_TRACT | Status: DC | PRN
  Administered 2015-12-26 (×2): 3 mL via RESPIRATORY_TRACT
  Filled 2015-12-24 (×3): qty 3

## 2015-12-24 MED ORDER — FUROSEMIDE 40 MG PO TABS: 60.0000 mg | ORAL_TABLET | Freq: Every day | ORAL | Status: DC

## 2015-12-24 MED ORDER — PIPERACILLIN-TAZOBACTAM 3.375 G IVPB
3.3750 g | Freq: Three times a day (TID) | INTRAVENOUS | Status: DC
  Administered 2015-12-25 – 2015-12-27 (×7): 3.375 g via INTRAVENOUS
  Filled 2015-12-24 (×9): qty 50

## 2015-12-24 MED ORDER — ACETAMINOPHEN 650 MG RE SUPP
650.0000 mg | Freq: Four times a day (QID) | RECTAL | Status: DC | PRN
  Filled 2015-12-24: qty 1

## 2015-12-24 MED ORDER — VANCOMYCIN HCL 10 G IV SOLR
2000.0000 mg | Freq: Once | INTRAVENOUS | Status: AC
  Administered 2015-12-24: 2000 mg via INTRAVENOUS
  Filled 2015-12-24: qty 2000

## 2015-12-24 MED ORDER — VANCOMYCIN HCL 10 G IV SOLR
1500.0000 mg | INTRAVENOUS | Status: DC
  Filled 2015-12-24: qty 1500

## 2015-12-24 MED ORDER — ALBUTEROL SULFATE (2.5 MG/3ML) 0.083% IN NEBU: 2.5000 mg | INHALATION_SOLUTION | RESPIRATORY_TRACT | Status: DC | PRN

## 2015-12-24 MED ORDER — ACETAMINOPHEN 325 MG PO TABS
650.0000 mg | ORAL_TABLET | Freq: Four times a day (QID) | ORAL | Status: DC | PRN
  Administered 2015-12-29: 650 mg via ORAL
  Filled 2015-12-24: qty 2

## 2015-12-24 MED ORDER — METOPROLOL SUCCINATE ER 25 MG PO TB24: 25.0000 mg | ORAL_TABLET | Freq: Every day | ORAL | Status: DC

## 2015-12-24 MED ORDER — SODIUM CHLORIDE 0.9 % IV BOLUS (SEPSIS): 500.0000 mL | Freq: Once | INTRAVENOUS | Status: DC

## 2015-12-24 MED ORDER — POTASSIUM CHLORIDE CRYS ER 10 MEQ PO TBCR
10.0000 meq | EXTENDED_RELEASE_TABLET | Freq: Every day | ORAL | Status: DC
  Administered 2015-12-26 – 2015-12-29 (×4): 10 meq via ORAL
  Filled 2015-12-24 (×7): qty 1

## 2015-12-24 NOTE — ED Notes (Signed)
Admitting MD at bedside.

## 2015-12-24 NOTE — H&P (Addendum)
History and Physical    BENJIMIN Salas AVW:[id number] DOB: June 04, 1933 DOA: 12/24/2015  Referring MD/NP/PA: Dr. Clarene Duke PCP: Mickie Hillier, MD  Patient coming from: Home to [place] [facility] then [facility] via EMS  Chief Complaint:  Bleeding from his penis   HPI: Daryl Salas is a 82 y.o. male with medical history significant of HTN, CHF, HLD, BPH; who presents with complaints of bleeding from penis and altered mental status. History is obtained from the wife and daughter as the patient is acutely confused more than usual and unable to provide his own. Wife states that she saw the patient was bleeding from the tip his penis is a well urinating. Patient denied scratching his penis or having any other trauma to her knowledge. They took him to the urgent care while and while there the patient started yawning. It was unusual as the patient continued to do this and became pale and diaphoretic. The daughter had nursing care at urgent care facility check his blood pressure and states that his systolic BPs were in 70s. Over the last few days the family notes that the patient had had decreased by mouth intake and question if he was dehydrated. Family notes that the patient has been followed by Dr Viann Fish of cardiology and had previous echocardiograms in the past. They are not aware of his current heart function.  ED Course: Upon admission into the emergency department patient was seen to be hypothermic temperature of 96.71F then febrile up to 100.8, heart rates 109-125, respirations 14-31, blood pressure maintained 102/57/170 7/69, O2 saturations 18 on 3 L of nasal cannula oxygen. Lab work revealed WBC 2.2, hemoglobin 15.1, platelets 147, sodium 141, potassium 3.9, chloride 105, CO2 19, BUN 16, creatinine 1.22, lactic acid 8.77. Patient was given 3 L of IV fluids in the ED with vancomycin and Zosyn per sepsis protocol. However, patient required nasal cannula oxygen to maintain O2  saturations. In out catheter revealed initially for bloody then turn pinkish urine. TRH called to admit to stepdown.  Review of Systems: As per HPI otherwise 10 point review of systems negative.   Past Medical History:  Diagnosis Date  . Hyperlipidemia   . Hypertension     Past Surgical History:  Procedure Laterality Date  . CARPAL TUNNEL RELEASE Right   . TOTAL KNEE ARTHROPLASTY Left 2005     reports that he has never smoked. He has never used smokeless tobacco. He reports that he does not drink alcohol or use drugs.  No Known Allergies  History reviewed. No pertinent family history.  Prior to Admission medications   Medication Sig Start Date End Date Taking? Authorizing Provider  aspirin 81 MG tablet Take 81 mg by mouth daily.   Yes Historical Provider, MD  benazepril (LOTENSIN) 40 MG tablet Take 40 mg by mouth daily.   Yes Historical Provider, MD  cholecalciferol (VITAMIN D) 1000 UNITS tablet Take 2,000 Units by mouth daily.   Yes Historical Provider, MD  Coenzyme Q10 (CO Q 10) 100 MG CAPS Take by mouth daily.   Yes Historical Provider, MD  donepezil (ARICEPT) 10 MG tablet Take 10 mg by mouth daily. 10/15/15  Yes Historical Provider, MD  dorzolamide (TRUSOPT) 2 % ophthalmic solution Place 1 drop into both eyes daily. 10/19/15  Yes Historical Provider, MD  finasteride (PROSCAR) 5 MG tablet Take 5 mg by mouth daily.   Yes Historical Provider, MD  fish oil-omega-3 fatty acids 1000 MG capsule Take 2 g by mouth  daily.   Yes Historical Provider, MD  furosemide (LASIX) 20 MG tablet Take 60 mg by mouth daily. 10/15/15  Yes Historical Provider, MD  metoprolol succinate (TOPROL-XL) 25 MG 24 hr tablet Take 25 mg by mouth daily. 11/23/15  Yes Historical Provider, MD  Multiple Vitamins-Minerals (MULTIVITAMIN WITH MINERALS) tablet Take 1 tablet by mouth daily.   Yes Historical Provider, MD  potassium chloride (K-DUR) 10 MEQ tablet Take 10 mEq by mouth daily. 10/15/15  Yes Historical Provider, MD   simvastatin (ZOCOR) 20 MG tablet Take 20 mg by mouth every evening.   Yes Historical Provider, MD  timolol (BETIMOL) 0.5 % ophthalmic solution Place 1 drop into both eyes 2 (two) times daily.    Yes Historical Provider, MD  vitamin E 400 UNIT capsule Take 400 Units by mouth daily.   Yes Historical Provider, MD    Physical Exam:     Constitutional: Obese male in moderate distress who appears uncomfortable not following commands Vitals:   12/24/15 1945 12/24/15 2015 12/24/15 2030 12/24/15 2124  BP: 124/62 123/68 102/57   Pulse: 114 115 112   Resp:   (!) 28   Temp:    100.8 F (38.2 C)  TempSrc:    Rectal  SpO2: 97% 98% 96%   Weight:      Height:       Eyes: PERRL, lids and conjunctivae normal ENMT: Mucous membranes are dry. Posterior pharynx clear of any exudate or lesions.   Neck: normal, supple, no masses, no thyromegaly Respiratory: Tachypneic with positive crackles heard in the mid to lower lung fields. With some accessory muscle uses. Cardiovascular: Tachycardic. +1 pitting lower extremity edema. 2+ pedal pulses. No carotid bruits.  Abdomen: no tenderness, no masses palpated. No hepatosplenomegaly. Bowel sounds positive.  Musculoskeletal: no clubbing / cyanosis. No joint deformity upper and lower extremities. Good ROM, no contractures. Normal muscle tone.  Skin: Dry skin. no rashes, lesions, ulcers. No induration Neurologic: CN 2-12 grossly intact. Sensation intact, DTR normal. Strength 5/5 in all 4.  Psychiatric:  Alert and oriented x 1. Agitated mood.     Labs on Admission: I have personally reviewed following labs and imaging studies  CBC:  Recent Labs Lab 12/24/15 1513  WBC 2.2*  NEUTROABS 1.8  HGB 15.1  HCT 47.3  MCV 94.0  PLT 147*   Basic Metabolic Panel:  Recent Labs Lab 12/24/15 1513  NA 141  K 3.9  CL 105  CO2 19*  GLUCOSE 129*  BUN 16  CREATININE 1.23  CALCIUM 8.8*   GFR: Estimated Creatinine Clearance: 66.3 mL/min (by C-G formula based  on SCr of 1.23 mg/dL). Liver Function Tests:  Recent Labs Lab 12/24/15 1513  AST 49*  ALT 26  ALKPHOS 56  BILITOT 1.9*  PROT 6.9  ALBUMIN 4.4   No results for input(s): LIPASE, AMYLASE in the last 168 hours. No results for input(s): AMMONIA in the last 168 hours. Coagulation Profile: No results for input(s): INR, PROTIME in the last 168 hours. Cardiac Enzymes: No results for input(s): CKTOTAL, CKMB, CKMBINDEX, TROPONINI in the last 168 hours. BNP (last 3 results) No results for input(s): PROBNP in the last 8760 hours. HbA1C: No results for input(s): HGBA1C in the last 72 hours. CBG:  Recent Labs Lab 12/24/15 1440  GLUCAP 115*   Lipid Profile: No results for input(s): CHOL, HDL, LDLCALC, TRIG, CHOLHDL, LDLDIRECT in the last 72 hours. Thyroid Function Tests: No results for input(s): TSH, T4TOTAL, FREET4, T3FREE, THYROIDAB in the last 72  hours. Anemia Panel: No results for input(s): VITAMINB12, FOLATE, FERRITIN, TIBC, IRON, RETICCTPCT in the last 72 hours. Urine analysis:    Component Value Date/Time   COLORURINE AMBER (A) 12/24/2015 1730   APPEARANCEUR CLOUDY (A) 12/24/2015 1730   LABSPEC 1.012 12/24/2015 1730   PHURINE 6.0 12/24/2015 1730   GLUCOSEU NEGATIVE 12/24/2015 1730   HGBUR LARGE (A) 12/24/2015 1730   BILIRUBINUR NEGATIVE 12/24/2015 1730   KETONESUR NEGATIVE 12/24/2015 1730   PROTEINUR 30 (A) 12/24/2015 1730   UROBILINOGEN 1.0 07/20/2009 0254   NITRITE NEGATIVE 12/24/2015 1730   LEUKOCYTESUR SMALL (A) 12/24/2015 1730   Sepsis Labs: No results found for this or any previous visit (from the past 240 hour(s)).   Radiological Exams on Admission: Ct Abdomen Pelvis Wo Contrast  Result Date: 12/24/2015 CLINICAL DATA:  Acute onset of sepsis.  Initial encounter. EXAM: CT CHEST, ABDOMEN AND PELVIS WITHOUT CONTRAST TECHNIQUE: Multidetector CT imaging of the chest, abdomen and pelvis was performed following the standard protocol without IV contrast. COMPARISON:   None. FINDINGS: CT CHEST FINDINGS Cardiovascular: Scattered coronary artery calcifications are seen. Calcification is noted at the aortic valve. Mild calcification is noted at the great vessels. The thoracic aorta is grossly unremarkable in appearance. The heart is normal in size. Mediastinum/Nodes: The mediastinum is otherwise grossly unremarkable. No mediastinal lymphadenopathy is seen. No pericardial effusion is identified. The visualized portions of the thyroid gland are unremarkable. No axillary lymphadenopathy is seen. Lungs/Pleura: Trace bilateral pleural fluid is noted, with patchy bibasilar opacities likely reflecting atelectasis. No pneumothorax is identified. No dominant mass is seen. Evaluation is suboptimal due to motion artifact. Musculoskeletal: No acute osseous abnormalities are identified. The chest wall is grossly unremarkable in appearance. CT ABDOMEN PELVIS FINDINGS Hepatobiliary: The liver is unremarkable in appearance. The gallbladder is unremarkable in appearance. The common bile duct remains normal in caliber. Pancreas: The pancreas is within normal limits. Spleen: The spleen is unremarkable in appearance. Adrenals/Urinary Tract: The adrenal glands are unremarkable in appearance. Nonspecific perinephric stranding is noted bilaterally. Mild bilateral renal atrophy is noted. There is no evidence of hydronephrosis. No renal or ureteral stones are seen. Stranding about the left renal pelvis could reflect mild left-sided pyelonephritis. Stomach/Bowel: The stomach is unremarkable in appearance. The small bowel is within normal limits. The appendix is not visualized; there is no evidence for appendicitis. Minimal diverticulosis is noted along the proximal sigmoid colon, without evidence of diverticulitis. Vascular/Lymphatic: Scattered calcification is seen along the abdominal aorta and its branches. The abdominal aorta is otherwise grossly unremarkable. The inferior vena cava is grossly  unremarkable. Visualized retroperitoneal nodes remain borderline normal in size. No pelvic sidewall lymphadenopathy is identified. Reproductive: The prostate is enlarged, measuring 5.4 cm in transverse dimension, with impression on the base of the bladder. The bladder is mildly distended and grossly unremarkable. Other: No additional soft tissue abnormalities are seen. Musculoskeletal: No acute osseous abnormalities are identified. The visualized musculature is unremarkable in appearance. IMPRESSION: 1. Stranding about the left renal pelvis could reflect mild left-sided pyelonephritis. Would correlate with the patient's symptoms. 2. Trace bilateral pleural fluid, with patchy bibasilar airspace opacities likely reflecting atelectasis. Evaluation of the lung fields is suboptimal due to motion artifact. 3. Scattered coronary artery calcifications seen. Calcification noted at the aortic valve. 4. Mild bilateral renal atrophy noted. 5. Minimal diverticulosis along the proximal sigmoid colon, without evidence of diverticulitis. 6. Scattered aortic atherosclerosis noted. 7. Enlarged prostate noted, with impression on the base of the bladder. Would correlate with PSA. Electronically  Signed   By: Roanna Raider M.D.   On: 12/24/2015 20:50   Ct Chest Wo Contrast  Result Date: 12/24/2015 CLINICAL DATA:  Acute onset of sepsis.  Initial encounter. EXAM: CT CHEST, ABDOMEN AND PELVIS WITHOUT CONTRAST TECHNIQUE: Multidetector CT imaging of the chest, abdomen and pelvis was performed following the standard protocol without IV contrast. COMPARISON:  None. FINDINGS: CT CHEST FINDINGS Cardiovascular: Scattered coronary artery calcifications are seen. Calcification is noted at the aortic valve. Mild calcification is noted at the great vessels. The thoracic aorta is grossly unremarkable in appearance. The heart is normal in size. Mediastinum/Nodes: The mediastinum is otherwise grossly unremarkable. No mediastinal lymphadenopathy is  seen. No pericardial effusion is identified. The visualized portions of the thyroid gland are unremarkable. No axillary lymphadenopathy is seen. Lungs/Pleura: Trace bilateral pleural fluid is noted, with patchy bibasilar opacities likely reflecting atelectasis. No pneumothorax is identified. No dominant mass is seen. Evaluation is suboptimal due to motion artifact. Musculoskeletal: No acute osseous abnormalities are identified. The chest wall is grossly unremarkable in appearance. CT ABDOMEN PELVIS FINDINGS Hepatobiliary: The liver is unremarkable in appearance. The gallbladder is unremarkable in appearance. The common bile duct remains normal in caliber. Pancreas: The pancreas is within normal limits. Spleen: The spleen is unremarkable in appearance. Adrenals/Urinary Tract: The adrenal glands are unremarkable in appearance. Nonspecific perinephric stranding is noted bilaterally. Mild bilateral renal atrophy is noted. There is no evidence of hydronephrosis. No renal or ureteral stones are seen. Stranding about the left renal pelvis could reflect mild left-sided pyelonephritis. Stomach/Bowel: The stomach is unremarkable in appearance. The small bowel is within normal limits. The appendix is not visualized; there is no evidence for appendicitis. Minimal diverticulosis is noted along the proximal sigmoid colon, without evidence of diverticulitis. Vascular/Lymphatic: Scattered calcification is seen along the abdominal aorta and its branches. The abdominal aorta is otherwise grossly unremarkable. The inferior vena cava is grossly unremarkable. Visualized retroperitoneal nodes remain borderline normal in size. No pelvic sidewall lymphadenopathy is identified. Reproductive: The prostate is enlarged, measuring 5.4 cm in transverse dimension, with impression on the base of the bladder. The bladder is mildly distended and grossly unremarkable. Other: No additional soft tissue abnormalities are seen. Musculoskeletal: No acute  osseous abnormalities are identified. The visualized musculature is unremarkable in appearance. IMPRESSION: 1. Stranding about the left renal pelvis could reflect mild left-sided pyelonephritis. Would correlate with the patient's symptoms. 2. Trace bilateral pleural fluid, with patchy bibasilar airspace opacities likely reflecting atelectasis. Evaluation of the lung fields is suboptimal due to motion artifact. 3. Scattered coronary artery calcifications seen. Calcification noted at the aortic valve. 4. Mild bilateral renal atrophy noted. 5. Minimal diverticulosis along the proximal sigmoid colon, without evidence of diverticulitis. 6. Scattered aortic atherosclerosis noted. 7. Enlarged prostate noted, with impression on the base of the bladder. Would correlate with PSA. Electronically Signed   By: Roanna Raider M.D.   On: 12/24/2015 20:50   Dg Chest Port 1 View  Result Date: 12/24/2015 CLINICAL DATA:  Sepsis and hypoxia EXAM: PORTABLE CHEST 1 VIEW COMPARISON:  03/12/2013 FINDINGS: Mild cardiac enlargement. Low lung volumes. Pulmonary vascular congestion noted. No pleural effusions or edema. No airspace consolidation. IMPRESSION: 1. Mild cardiac enlargement and pulmonary vascular congestion. Electronically Signed   By: Signa Kell M.D.   On: 12/24/2015 18:13    EKG: Independently reviewed. Possibly atrial fibrillation with a rate of 122  Assessment/Plan  Sepsis of unknown source: Acute. Patient presents with reported hypotension BP in the 70's, temperature up  to 96.5 - 100.63F heart rates  105 - 125, respirations up to 31. Patient found to have lactic acid to 8.77 on admission, and then subsequent lactic acid 4.36. Source is not clearly known at this time as UA was not clearly suggestive of a urinary tract infection. Imaging studies showed signs of mild left pyelonephritis possibly.Patient was given 3 L of IV fluids in the ED.  - Admit to stepdown  - Follow-up blood and urine cultures  - Empiric  antibiotics of vancomycin and Zosyn  - Trend lactic acid levels - Tylenol  prn fever   Acute respiratory failure: At baseline patient not on oxygen at home patient required at least 3 L nasal cannula oxygen to maintain O2 sats following IV fluid hydration for sepsis protocol. - Continuous pulse oximetry with nasal cannula oxygen and keep O2 sats greater than 92%  Hematuria: Acute.No previous history of this time per family. Possible obstruction secondary to bleeding. Patient sees Dr. Wilson Singer of Urology as outpatient - Place three-way Foley catheter with coude tip for BPH - Monitor bleeding from meatus - Consider consulting urology in a.m. to evaluate reason for hematuria  Possible atrial fibrillation: Acute. no previous history although cannot be completely sure given a lot of artifact on EKG.  - continue to monitor - Check TSH - Recheck EKG in a.m.  Transient hypotension( resolved)/Essential hypertension: Blood pressures currently stable at this time. Question gallstone transient hypotension as possibly being dehydrated pior to the patient's initial presentation. - continue benazepril and metropolol when able  Congestive heart failure with possible acute exacerbation : Suspect possibly Iatrogenic. Patient was initially given 3 L of IV fluids for sepsis per protocol although patient supposed to receive 4. Patient requiring nasal cannula oxygen of 3 L to maintain O2 saturations with some tachypnea. Chest x-ray showing cardiomegaly with signs of central congestion possibly after first liter of fluids. Recommended the ED physician to give 40 mg of Lasix IV. - Strict In and Outs - check BNP - Continue home Lasix dose when able - Reassess in a.m. for need of IV diuretics  Acute encephalopathy/Dementia with behavioral disturbance  - Will continue to monitor and use Ativan prn agitation - Continued donepezil once able - Changed patient to NPO, speech therapy to eval and treat  BPH -  Continue  Proscar once able   DVT prophylaxis:  SCD   Code Status: Full  Family Communication:  discussed overall plan with the patient and wife present at bedside  Disposition Plan: Possibly discharge home once medically stable Consults called: None Admission status:  Inpatient Stepdown estimated stay 3-4 days    Clydie Braun MD [facility] Pager [phone]  If 7PM-7AM, please contact night-coverage [email or web address] Password [email or web address]  12/24/2015, 9:50 PM

## 2015-12-24 NOTE — ED Provider Notes (Signed)
[facility]-EMERGENCY DEPT Provider Note   CSN: [id number] Arrival date & time: 12/24/15  1432     History   Chief Complaint Chief Complaint  Patient presents with  . Urinary Tract Infection    HPI Daryl Salas is a 82 y.o. male.  82 year old male with past medical history including dementia, CHF, hypertension who presents with altered mental status. History obtained from wife and daughter, who report that this morning the patient has been more confused than usual. Wife states that she saw a few drops of blood at the tip of his urethral meatus when he was urinating. He was noted to be diaphoretic and pale on EMS arrival. They state that he was in his usual state of health yesterday. No vomiting, diarrhea, cough/cold symptoms, fevers, or recent illness.  LEVEL 5 CAVEAT DUE TO AMS   The history is provided by the spouse and a relative.  Urinary Tract Infection      Past Medical History:  Diagnosis Date  . Hyperlipidemia   . Hypertension     Patient Active Problem List   Diagnosis Date Noted  . Abscess of left thigh 08/11/2012    Past Surgical History:  Procedure Laterality Date  . CARPAL TUNNEL RELEASE Right   . TOTAL KNEE ARTHROPLASTY Left 2005       Home Medications    Prior to Admission medications   Medication Sig Start Date End Date Taking? Authorizing Provider  aspirin 81 MG tablet Take 81 mg by mouth daily.   Yes Historical Provider, MD  benazepril (LOTENSIN) 40 MG tablet Take 40 mg by mouth daily.   Yes Historical Provider, MD  cholecalciferol (VITAMIN D) 1000 UNITS tablet Take 2,000 Units by mouth daily.   Yes Historical Provider, MD  Coenzyme Q10 (CO Q 10) 100 MG CAPS Take by mouth daily.   Yes Historical Provider, MD  donepezil (ARICEPT) 10 MG tablet Take 10 mg by mouth daily. 10/15/15  Yes Historical Provider, MD  dorzolamide (TRUSOPT) 2 % ophthalmic solution Place 1 drop into both eyes daily. 10/19/15  Yes Historical Provider, MD  finasteride (PROSCAR)  5 MG tablet Take 5 mg by mouth daily.   Yes Historical Provider, MD  fish oil-omega-3 fatty acids 1000 MG capsule Take 2 g by mouth daily.   Yes Historical Provider, MD  furosemide (LASIX) 20 MG tablet Take 60 mg by mouth daily. 10/15/15  Yes Historical Provider, MD  metoprolol succinate (TOPROL-XL) 25 MG 24 hr tablet Take 25 mg by mouth daily. 11/23/15  Yes Historical Provider, MD  Multiple Vitamins-Minerals (MULTIVITAMIN WITH MINERALS) tablet Take 1 tablet by mouth daily.   Yes Historical Provider, MD  potassium chloride (K-DUR) 10 MEQ tablet Take 10 mEq by mouth daily. 10/15/15  Yes Historical Provider, MD  simvastatin (ZOCOR) 20 MG tablet Take 20 mg by mouth every evening.   Yes Historical Provider, MD  timolol (BETIMOL) 0.5 % ophthalmic solution Place 1 drop into both eyes 2 (two) times daily.    Yes Historical Provider, MD  vitamin E 400 UNIT capsule Take 400 Units by mouth daily.   Yes Historical Provider, MD    Family History History reviewed. No pertinent family history.  Social History Social History  Substance Use Topics  . Smoking status: Never Smoker  . Smokeless tobacco: Never Used  . Alcohol use No     Allergies   Review of patient's allergies indicates no known allergies.   Review of Systems Review of Systems  Unable to perform ROS:  Dementia     Physical Exam Updated Vital Signs SpO2 96%   Physical Exam  Constitutional: He appears well-developed and well-nourished. No distress.  Eyes closed, faint shivering  HENT:  Head: Normocephalic and atraumatic.  Dry mucous membranes  Eyes: Conjunctivae are normal. Pupils are equal, round, and reactive to light.  Neck: Neck supple.  Cardiovascular: Regular rhythm and normal heart sounds.  Tachycardia present.   No murmur heard. Pulmonary/Chest: No respiratory distress.  Tachypnea with diminished BS b/l  Abdominal: Soft. Bowel sounds are normal. He exhibits no distension. There is no tenderness.  Musculoskeletal: He  exhibits edema (trace BLE).  Neurological:  Awake but eyes closed, altered, unable to follow commands, moving all 4 extremities  Skin: Skin is warm and dry. No rash noted. There is pallor.  Nursing note and vitals reviewed.    ED Treatments / Results  Labs (all labs ordered are listed, but only abnormal results are displayed) Labs Reviewed  COMPREHENSIVE METABOLIC PANEL - Abnormal; Notable for the following:       Result Value   CO2 19 (*)    Glucose, Bld 129 (*)    Calcium 8.8 (*)    AST 49 (*)    Total Bilirubin 1.9 (*)    GFR calc non Af Amer 54 (*)    Anion gap 17 (*)    All other components within normal limits  CBC WITH DIFFERENTIAL/PLATELET - Abnormal; Notable for the following:    WBC 2.2 (*)    Platelets 147 (*)    Lymphs Abs 0.3 (*)    Monocytes Absolute 0.0 (*)    All other components within normal limits  URINALYSIS, ROUTINE W REFLEX MICROSCOPIC (NOT AT [facility]) - Abnormal; Notable for the following:    Color, Urine AMBER (*)    APPearance CLOUDY (*)    Hgb urine dipstick LARGE (*)    Protein, ur 30 (*)    Leukocytes, UA SMALL (*)    All other components within normal limits  URINE MICROSCOPIC-ADD ON - Abnormal; Notable for the following:    Squamous Epithelial / LPF 0-5 (*)    Bacteria, UA RARE (*)    Casts HYALINE CASTS (*)    All other components within normal limits  CBG MONITORING, ED - Abnormal; Notable for the following:    Glucose-Capillary 115 (*)    All other components within normal limits  I-STAT CG4 LACTIC ACID, ED - Abnormal; Notable for the following:    Lactic Acid, Venous 8.77 (*)    All other components within normal limits  I-STAT CG4 LACTIC ACID, ED - Abnormal; Notable for the following:    Lactic Acid, Venous 4.36 (*)    All other components within normal limits  CULTURE, BLOOD (ROUTINE X 2)  CULTURE, BLOOD (ROUTINE X 2)  URINE CULTURE  LACTIC ACID, PLASMA  LACTIC ACID, PLASMA  PROTIME-INR  APTT  TSH  TROPONIN I  TROPONIN I    TROPONIN I  BRAIN NATRIURETIC PEPTIDE  I-STAT BETA HCG BLOOD, ED ([facility], [facility], [facility] ONLY)    EKG  EKG Interpretation  Date/Time:  Monday December 24 2015 14:58:22 EDT Ventricular Rate:  122 PR Interval:    QRS Duration: 148 QT Interval:  329 QTC Calculation: 469 R Axis:   132 Text Interpretation:  Atrial fibrillation Right bundle branch block Artifact in lead(s) I II III aVR aVL aVF V1 V2 V5 V6 difficult to assess due to artifact, A fib vs sinus with artifact Confirmed by Hooper Petteway MD,  Thadeus Gandolfi [phone]([id number]) on 12/24/2015 3:19:15 PM       Radiology Ct Abdomen Pelvis Wo Contrast  Result Date: 12/24/2015 CLINICAL DATA:  Acute onset of sepsis.  Initial encounter. EXAM: CT CHEST, ABDOMEN AND PELVIS WITHOUT CONTRAST TECHNIQUE: Multidetector CT imaging of the chest, abdomen and pelvis was performed following the standard protocol without IV contrast. COMPARISON:  None. FINDINGS: CT CHEST FINDINGS Cardiovascular: Scattered coronary artery calcifications are seen. Calcification is noted at the aortic valve. Mild calcification is noted at the great vessels. The thoracic aorta is grossly unremarkable in appearance. The heart is normal in size. Mediastinum/Nodes: The mediastinum is otherwise grossly unremarkable. No mediastinal lymphadenopathy is seen. No pericardial effusion is identified. The visualized portions of the thyroid gland are unremarkable. No axillary lymphadenopathy is seen. Lungs/Pleura: Trace bilateral pleural fluid is noted, with patchy bibasilar opacities likely reflecting atelectasis. No pneumothorax is identified. No dominant mass is seen. Evaluation is suboptimal due to motion artifact. Musculoskeletal: No acute osseous abnormalities are identified. The chest wall is grossly unremarkable in appearance. CT ABDOMEN PELVIS FINDINGS Hepatobiliary: The liver is unremarkable in appearance. The gallbladder is unremarkable in appearance. The common bile duct remains normal in caliber. Pancreas: The pancreas  is within normal limits. Spleen: The spleen is unremarkable in appearance. Adrenals/Urinary Tract: The adrenal glands are unremarkable in appearance. Nonspecific perinephric stranding is noted bilaterally. Mild bilateral renal atrophy is noted. There is no evidence of hydronephrosis. No renal or ureteral stones are seen. Stranding about the left renal pelvis could reflect mild left-sided pyelonephritis. Stomach/Bowel: The stomach is unremarkable in appearance. The small bowel is within normal limits. The appendix is not visualized; there is no evidence for appendicitis. Minimal diverticulosis is noted along the proximal sigmoid colon, without evidence of diverticulitis. Vascular/Lymphatic: Scattered calcification is seen along the abdominal aorta and its branches. The abdominal aorta is otherwise grossly unremarkable. The inferior vena cava is grossly unremarkable. Visualized retroperitoneal nodes remain borderline normal in size. No pelvic sidewall lymphadenopathy is identified. Reproductive: The prostate is enlarged, measuring 5.4 cm in transverse dimension, with impression on the base of the bladder. The bladder is mildly distended and grossly unremarkable. Other: No additional soft tissue abnormalities are seen. Musculoskeletal: No acute osseous abnormalities are identified. The visualized musculature is unremarkable in appearance. IMPRESSION: 1. Stranding about the left renal pelvis could reflect mild left-sided pyelonephritis. Would correlate with the patient's symptoms. 2. Trace bilateral pleural fluid, with patchy bibasilar airspace opacities likely reflecting atelectasis. Evaluation of the lung fields is suboptimal due to motion artifact. 3. Scattered coronary artery calcifications seen. Calcification noted at the aortic valve. 4. Mild bilateral renal atrophy noted. 5. Minimal diverticulosis along the proximal sigmoid colon, without evidence of diverticulitis. 6. Scattered aortic atherosclerosis noted. 7.  Enlarged prostate noted, with impression on the base of the bladder. Would correlate with PSA. Electronically Signed   By: Roanna RaiderJeffery  Chang M.D.   On: 12/24/2015 20:50   Ct Chest Wo Contrast  Result Date: 12/24/2015 CLINICAL DATA:  Acute onset of sepsis.  Initial encounter. EXAM: CT CHEST, ABDOMEN AND PELVIS WITHOUT CONTRAST TECHNIQUE: Multidetector CT imaging of the chest, abdomen and pelvis was performed following the standard protocol without IV contrast. COMPARISON:  None. FINDINGS: CT CHEST FINDINGS Cardiovascular: Scattered coronary artery calcifications are seen. Calcification is noted at the aortic valve. Mild calcification is noted at the great vessels. The thoracic aorta is grossly unremarkable in appearance. The heart is normal in size. Mediastinum/Nodes: The mediastinum is otherwise grossly unremarkable. No mediastinal lymphadenopathy is  seen. No pericardial effusion is identified. The visualized portions of the thyroid gland are unremarkable. No axillary lymphadenopathy is seen. Lungs/Pleura: Trace bilateral pleural fluid is noted, with patchy bibasilar opacities likely reflecting atelectasis. No pneumothorax is identified. No dominant mass is seen. Evaluation is suboptimal due to motion artifact. Musculoskeletal: No acute osseous abnormalities are identified. The chest wall is grossly unremarkable in appearance. CT ABDOMEN PELVIS FINDINGS Hepatobiliary: The liver is unremarkable in appearance. The gallbladder is unremarkable in appearance. The common bile duct remains normal in caliber. Pancreas: The pancreas is within normal limits. Spleen: The spleen is unremarkable in appearance. Adrenals/Urinary Tract: The adrenal glands are unremarkable in appearance. Nonspecific perinephric stranding is noted bilaterally. Mild bilateral renal atrophy is noted. There is no evidence of hydronephrosis. No renal or ureteral stones are seen. Stranding about the left renal pelvis could reflect mild left-sided  pyelonephritis. Stomach/Bowel: The stomach is unremarkable in appearance. The small bowel is within normal limits. The appendix is not visualized; there is no evidence for appendicitis. Minimal diverticulosis is noted along the proximal sigmoid colon, without evidence of diverticulitis. Vascular/Lymphatic: Scattered calcification is seen along the abdominal aorta and its branches. The abdominal aorta is otherwise grossly unremarkable. The inferior vena cava is grossly unremarkable. Visualized retroperitoneal nodes remain borderline normal in size. No pelvic sidewall lymphadenopathy is identified. Reproductive: The prostate is enlarged, measuring 5.4 cm in transverse dimension, with impression on the base of the bladder. The bladder is mildly distended and grossly unremarkable. Other: No additional soft tissue abnormalities are seen. Musculoskeletal: No acute osseous abnormalities are identified. The visualized musculature is unremarkable in appearance. IMPRESSION: 1. Stranding about the left renal pelvis could reflect mild left-sided pyelonephritis. Would correlate with the patient's symptoms. 2. Trace bilateral pleural fluid, with patchy bibasilar airspace opacities likely reflecting atelectasis. Evaluation of the lung fields is suboptimal due to motion artifact. 3. Scattered coronary artery calcifications seen. Calcification noted at the aortic valve. 4. Mild bilateral renal atrophy noted. 5. Minimal diverticulosis along the proximal sigmoid colon, without evidence of diverticulitis. 6. Scattered aortic atherosclerosis noted. 7. Enlarged prostate noted, with impression on the base of the bladder. Would correlate with PSA. Electronically Signed   By: Roanna Raider M.D.   On: 12/24/2015 20:50   Dg Chest Port 1 View  Result Date: 12/24/2015 CLINICAL DATA:  Sepsis and hypoxia EXAM: PORTABLE CHEST 1 VIEW COMPARISON:  03/12/2013 FINDINGS: Mild cardiac enlargement. Low lung volumes. Pulmonary vascular congestion  noted. No pleural effusions or edema. No airspace consolidation. IMPRESSION: 1. Mild cardiac enlargement and pulmonary vascular congestion. Electronically Signed   By: Signa Kell M.D.   On: 12/24/2015 18:13    Procedures .Critical Care Performed by: Laurence Spates Authorized by: Laurence Spates   Critical care provider statement:    Critical care time (minutes):  60   Critical care time was exclusive of:  Separately billable procedures and treating other patients   Critical care was necessary to treat or prevent imminent or life-threatening deterioration of the following conditions:  Sepsis   Critical care was time spent personally by me on the following activities:  Development of treatment plan with patient or surrogate, evaluation of patient's response to treatment, examination of patient, obtaining history from patient or surrogate, ordering and performing treatments and interventions, ordering and review of laboratory studies, ordering and review of radiographic studies, re-evaluation of patient's condition, pulse oximetry and review of old charts   (including critical care time)  Medications Ordered in ED Medications  piperacillin-tazobactam (ZOSYN) IVPB 3.375 g (not administered)  vancomycin (VANCOCIN) IVPB 1000 mg/200 mL premix (not administered)  sodium chloride 0.9 % bolus 1,000 mL (not administered)     Initial Impression / Assessment and Plan / ED Course  I have reviewed the triage vital signs and the nursing notes.  Pertinent labs & imaging results that were available during my care of the patient were reviewed by me and considered in my medical decision making (see chart for details).  Clinical Course   Patient with dementia and history of CHF presents with altered mental status beginning this morning, small amount of blood noted in urine by wife but no complaints of pain. He was altered, hypothermic, and shivering on arrival. No abdominal tenderness and  no respiratory distress although the patient has oxygen requirement of 3 L. Obtained IV access and initiated a code sepsis with blood and urine cultures, lactate, vancomycin and Zosyn, and 1 liter bolus given hypoxia with hx of CHF. I re-evaluated Respiratory status and vital signs after 1 L and was able to sequentially give 2 more liters without worsening hypoxia. Although the patient's weight-based dosing would recommend 4 L, I am concerned about volume overload given that his chest x-ray shows pulmonary vascular congestion without infiltrate.  Labs show WBC 2.2, initial lactate 8.7 with improvement to 4 after fluids, UA with blood, small leukocytes. I was called to room during catheterization, where patient was noted to have frank blood at urethral meatus. They were able to pass catheter with eventual clearing of urine. Patient later had difficulty urinating and we placed a Foley catheter for retention. Because of unexplained gross hematuria and sepsis of unclear etiology, obtained CT of chest abdomen and pelvis. He had some stranding around left renal pelvis, possibly suggestive of pyelonephritis, no other acute findings except trace bilateral pleural fluid. I discussed admission with [facility] hospitalist, Dr. Katrinka Blazing, and patient admitted for further treatment of sepsis.  Final Clinical Impressions(s) / ED Diagnoses   Final diagnoses:  Sepsis (HCC)  Tachycardia  Hypoxia    New Prescriptions New Prescriptions   No medications on file     Laurence Spates, MD 12/25/15 [phone]

## 2015-12-24 NOTE — ED Triage Notes (Signed)
Pt arrives from [facility]. Pt family states they noticed he was acting more altered than normal, was pale cool and diaphoretic upon EMS arrival. Pt family reports incontinence and hematuria.

## 2015-12-24 NOTE — ED Notes (Signed)
MD at bedside. 

## 2015-12-24 NOTE — Progress Notes (Signed)
Pharmacy Antibiotic Note  Yehya Harriet MassonM Heupel is a 82 y.o. male admitted on 12/24/2015 with sepsis.  Pharmacy has been consulted for vancomycin and zosyn dosing. CC of AMS with diaphoresis, hematuria and incontinence. Lactic elevated at 8.77.  Plan: Vancomycin 2000 mg IV x 1 Vancomycin 1500 mg IV q24h Zosyn 3.375 g IV 30 min infusion x1 Zosyn 3.375 g IV q8h extended infusion  Monitor clinical course and renal function    No data recorded.   Recent Labs Lab 12/24/15 1524  LATICACIDVEN 8.77*    CrCl cannot be calculated (Unknown ideal weight.).   Normalized CrCl 51 mL/min and stable  No Known Allergies  Antimicrobials this admission:  Vanc 10/9 >> Zosyn 10/9 >>  Dose adjustments this admission:  N/A  Microbiology results:  10/9 BCx: Sent 10/9 UCx: Sent Thank you for allowing pharmacy to be a part of this patient's care.  Lianne CureJustin R Natalya Domzalski, PharmD Candidate 12/24/2015 3:45 PM

## 2015-12-24 NOTE — ED Notes (Signed)
Contacted xray about portable chest no showing up in epic.

## 2015-12-24 NOTE — ED Notes (Addendum)
Attempted IV x1 with no success. Pt attempting to get out of bed. Phlebotomy at bedside.

## 2015-12-25 ENCOUNTER — Encounter (HOSPITAL_COMMUNITY): Payer: Self-pay

## 2015-12-25 DIAGNOSIS — A419 Sepsis, unspecified organism: Secondary | ICD-10-CM

## 2015-12-25 DIAGNOSIS — N400 Enlarged prostate without lower urinary tract symptoms: Secondary | ICD-10-CM | POA: Insufficient documentation

## 2015-12-25 DIAGNOSIS — R319 Hematuria, unspecified: Secondary | ICD-10-CM | POA: Diagnosis present

## 2015-12-25 DIAGNOSIS — G9340 Encephalopathy, unspecified: Secondary | ICD-10-CM | POA: Diagnosis present

## 2015-12-25 LAB — BLOOD CULTURE ID PANEL (REFLEXED)
ACINETOBACTER BAUMANNII: NOT DETECTED
CANDIDA ALBICANS: NOT DETECTED
CANDIDA KRUSEI: NOT DETECTED
CANDIDA PARAPSILOSIS: NOT DETECTED
CANDIDA TROPICALIS: NOT DETECTED
Candida glabrata: NOT DETECTED
ENTEROBACTERIACEAE SPECIES: NOT DETECTED
ENTEROCOCCUS SPECIES: NOT DETECTED
Enterobacter cloacae complex: NOT DETECTED
Escherichia coli: NOT DETECTED
HAEMOPHILUS INFLUENZAE: NOT DETECTED
KLEBSIELLA PNEUMONIAE: NOT DETECTED
Klebsiella oxytoca: NOT DETECTED
Listeria monocytogenes: NOT DETECTED
NEISSERIA MENINGITIDIS: NOT DETECTED
Proteus species: NOT DETECTED
Pseudomonas aeruginosa: NOT DETECTED
STREPTOCOCCUS PYOGENES: NOT DETECTED
STREPTOCOCCUS SPECIES: NOT DETECTED
Serratia marcescens: NOT DETECTED
Staphylococcus aureus (BCID): NOT DETECTED
Staphylococcus species: NOT DETECTED
Streptococcus agalactiae: NOT DETECTED
Streptococcus pneumoniae: NOT DETECTED

## 2015-12-25 LAB — COMPREHENSIVE METABOLIC PANEL
ALT: 61 U/L (ref 17–63)
ANION GAP: 9 (ref 5–15)
AST: 94 U/L — ABNORMAL HIGH (ref 15–41)
Albumin: 3.2 g/dL — ABNORMAL LOW (ref 3.5–5.0)
Alkaline Phosphatase: 37 U/L — ABNORMAL LOW (ref 38–126)
BUN: 19 mg/dL (ref 6–20)
CHLORIDE: 110 mmol/L (ref 101–111)
CO2: 23 mmol/L (ref 22–32)
CREATININE: 1.15 mg/dL (ref 0.61–1.24)
Calcium: 7.8 mg/dL — ABNORMAL LOW (ref 8.9–10.3)
GFR, EST NON AFRICAN AMERICAN: 59 mL/min — AB (ref >60)
Glucose, Bld: 156 mg/dL — ABNORMAL HIGH (ref 65–99)
POTASSIUM: 3.6 mmol/L (ref 3.5–5.1)
Sodium: 142 mmol/L (ref 135–145)
Total Bilirubin: 1.4 mg/dL — ABNORMAL HIGH (ref 0.3–1.2)
Total Protein: 5.8 g/dL — ABNORMAL LOW (ref 6.5–8.1)

## 2015-12-25 LAB — MRSA PCR SCREENING: MRSA by PCR: NEGATIVE

## 2015-12-25 LAB — APTT: APTT: 29 s (ref 24–36)

## 2015-12-25 LAB — AMMONIA: AMMONIA: 18 umol/L (ref 9–35)

## 2015-12-25 LAB — LACTIC ACID, PLASMA
LACTIC ACID, VENOUS: 7.2 mmol/L — AB (ref 0.5–1.9)
LACTIC ACID, VENOUS: 4.7 mmol/L — AB (ref 0.5–1.9)
LACTIC ACID, VENOUS: 2.3 mmol/L — AB (ref 0.5–1.9)
LACTIC ACID, VENOUS: 2.3 mmol/L — AB (ref 0.5–1.9)
Lactic Acid, Venous: 3.3 mmol/L (ref 0.5–1.9)

## 2015-12-25 LAB — TSH: TSH: 1.154 u[IU]/mL (ref 0.350–4.500)

## 2015-12-25 LAB — PROTIME-INR
INR: 1.44
Prothrombin Time: 17.7 seconds — ABNORMAL HIGH (ref 11.4–15.2)

## 2015-12-25 LAB — TROPONIN I
TROPONIN I: 3.91 ng/mL — AB (ref <0.03)
TROPONIN I: 5.15 ng/mL — AB (ref <0.03)
TROPONIN I: 4.04 ng/mL — AB (ref <0.03)
Troponin I: 0.59 ng/mL (ref <0.03)
Troponin I: 1.24 ng/mL (ref <0.03)

## 2015-12-25 LAB — BRAIN NATRIURETIC PEPTIDE: B Natriuretic Peptide: 474 pg/mL — ABNORMAL HIGH (ref 0.0–100.0)

## 2015-12-25 MED ORDER — HEPARIN (PORCINE) IN NACL 100-0.45 UNIT/ML-% IJ SOLN
1300.0000 [IU]/h | INTRAMUSCULAR | Status: DC
  Filled 2015-12-25: qty 250

## 2015-12-25 MED ORDER — SODIUM CHLORIDE 0.9 % IV SOLN
INTRAVENOUS | Status: DC
  Administered 2015-12-25 (×2): via INTRAVENOUS

## 2015-12-25 MED ORDER — HEPARIN BOLUS VIA INFUSION
4000.0000 [IU] | Freq: Once | INTRAVENOUS | Status: AC
  Administered 2015-12-25: 4000 [IU] via INTRAVENOUS
  Filled 2015-12-25: qty 4000

## 2015-12-25 MED ORDER — STERILE WATER FOR INJECTION IJ SOLN
INTRAMUSCULAR | Status: AC
  Administered 2015-12-25: 1.2 mL
  Filled 2015-12-25: qty 10

## 2015-12-25 MED ORDER — HALOPERIDOL LACTATE 5 MG/ML IJ SOLN
1.0000 mg | Freq: Four times a day (QID) | INTRAMUSCULAR | Status: DC | PRN
  Administered 2015-12-25 – 2015-12-29 (×5): 1 mg via INTRAVENOUS
  Filled 2015-12-25 (×5): qty 1

## 2015-12-25 MED ORDER — HALOPERIDOL LACTATE 5 MG/ML IJ SOLN
5.0000 mg | Freq: Once | INTRAMUSCULAR | Status: AC
  Administered 2015-12-25: 5 mg via INTRAVENOUS
  Filled 2015-12-25: qty 1

## 2015-12-25 MED ORDER — ZIPRASIDONE MESYLATE 20 MG IM SOLR
5.0000 mg | INTRAMUSCULAR | Status: AC
  Administered 2015-12-25: 5 mg via INTRAMUSCULAR
  Filled 2015-12-25: qty 20

## 2015-12-25 MED ORDER — ZIPRASIDONE MESYLATE 20 MG IM SOLR: 10.0000 mg | Freq: Once | INTRAMUSCULAR | Status: DC

## 2015-12-25 MED ORDER — TIMOLOL MALEATE 0.5 % OP SOLN
1.0000 [drp] | Freq: Two times a day (BID) | OPHTHALMIC | Status: DC
  Administered 2015-12-25 – 2015-12-29 (×7): 1 [drp] via OPHTHALMIC
  Filled 2015-12-25: qty 5

## 2015-12-25 MED ORDER — HEPARIN (PORCINE) IN NACL 100-0.45 UNIT/ML-% IJ SOLN
2000.0000 [IU]/h | INTRAMUSCULAR | Status: DC
  Administered 2015-12-25: 1300 [IU]/h via INTRAVENOUS
  Administered 2015-12-26: 1700 [IU]/h via INTRAVENOUS
  Administered 2015-12-26: 2000 [IU]/h via INTRAVENOUS
  Filled 2015-12-25 (×2): qty 250

## 2015-12-25 NOTE — Progress Notes (Signed)
Notified Regalado MD and Elink of rising troponins. Pt asymptomatic, 12 lead EKG obtained, waiting on cardiology consult at this time.

## 2015-12-25 NOTE — Consult Note (Signed)
PULMONARY / CRITICAL CARE MEDICINE   Name: Daryl Salas MRN: [id number] DOB: 15-Sep-1933    ADMISSION DATE:  12/24/2015 CONSULTATION DATE:  12/25/15  REFERRING MD:  Lonia Farber MD  CHIEF COMPLAINT:  Septic shock.  HISTORY OF PRESENT ILLNESS:   Daryl Salas is a 82 year old with past medical history of hypertension, hyperlipidemia, dementia. He was admitted yesterday with bleeding from the tip of the penis. He was noted to be hypotensive in urgent care and brought to [facility] ED. When he was found to have leukopenia, elevated lactic acid, hypertension, borderline urinalysis. He had a CT of the abdomen pelvis which showed possible pyelonephritis. He was given 3 L of IV fluid and is on maintenance at 75 mL in a. Lactic acid improved initially but then elevated again. PCCM called in consultation. Overnight he was also agitated, delirious and was given Haldol. On my examination he appears stable with sats in the mid 90s on 3 L, map of 68. Repeat lactic acid showed improvement again to around 4.7.  He has history of heart disease with atrial fibrillation, diastolic dysfunction on echocardiogram in 2011 with normal LVEF.   PAST MEDICAL HISTORY :  He  has a past medical history of Hyperlipidemia and Hypertension.  PAST SURGICAL HISTORY: He  has a past surgical history that includes Total knee arthroplasty (Left, 2005) and Carpal tunnel release (Right).  No Known Allergies  No current facility-administered medications on file prior to encounter.    Current Outpatient Prescriptions on File Prior to Encounter  Medication Sig  . aspirin 81 MG tablet Take 81 mg by mouth daily.  . benazepril (LOTENSIN) 40 MG tablet Take 40 mg by mouth daily.  . cholecalciferol (VITAMIN D) 1000 UNITS tablet Take 2,000 Units by mouth daily.  . Coenzyme Q10 (CO Q 10) 100 MG CAPS Take by mouth daily.  . finasteride (PROSCAR) 5 MG tablet Take 5 mg by mouth daily.  . fish oil-omega-3 fatty acids 1000 MG capsule  Take 2 g by mouth daily.  . Multiple Vitamins-Minerals (MULTIVITAMIN WITH MINERALS) tablet Take 1 tablet by mouth daily.  . simvastatin (ZOCOR) 20 MG tablet Take 20 mg by mouth every evening.  . timolol (BETIMOL) 0.5 % ophthalmic solution Place 1 drop into both eyes 2 (two) times daily.   . vitamin E 400 UNIT capsule Take 400 Units by mouth daily.    FAMILY HISTORY:  His indicated that his mother is deceased. He indicated that his father is deceased.    SOCIAL HISTORY: He  reports that he has never smoked. He has never used smokeless tobacco. He reports that he does not drink alcohol or use drugs.  REVIEW OF SYSTEMS:   Unable to obtain due to altered mental status  SUBJECTIVE:    VITAL SIGNS: BP 125/70 (BP Location: Right Arm)   Pulse (!) 103   Temp (!) 100.7 F (38.2 C) (Axillary)   Resp (!) 25   Ht 5' 11.65" (1.82 m)   Wt 276 lb (125.2 kg)   SpO2 100%   BMI 37.79 kg/m   HEMODYNAMICS:    VENTILATOR SETTINGS:    INTAKE / OUTPUT: I/O last 3 completed shifts: In: 3755 [I.V.:205; IV Piggyback:3550] Out: 825 [Urine:825]  PHYSICAL EXAMINATION: General:  No distress,  Neuro:  Sedated, opens eyes to command HEENT:  Dry mucus membranes, No thyromegaly, JVD Cardiovascular:  RRR, no MRG Lungs:  Clear Abdomen:  Soft, + BS Musculoskeletal:  Normal tone and bulk Skin:  Intact  LABS:  BMET  Recent Labs Lab 12/24/15 1513  NA 141  K 3.9  CL 105  CO2 19*  BUN 16  CREATININE 1.23  GLUCOSE 129*    Electrolytes  Recent Labs Lab 12/24/15 1513  CALCIUM 8.8*    CBC  Recent Labs Lab 12/24/15 1513  WBC 2.2*  HGB 15.1  HCT 47.3  PLT 147*    Coag's  Recent Labs Lab 12/25/15 0141  APTT 29  INR 1.44    Sepsis Markers  Recent Labs Lab 12/24/15 1853 12/25/15 0210 12/25/15 0756  LATICACIDVEN 4.36* 7.2* 4.7*    ABG No results for input(s): PHART, PCO2ART, PO2ART in the last 168 hours.  Liver Enzymes  Recent Labs Lab 12/24/15 1513  AST  49*  ALT 26  ALKPHOS 56  BILITOT 1.9*  ALBUMIN 4.4    Cardiac Enzymes  Recent Labs Lab 12/25/15 0141 12/25/15 0514  TROPONINI 0.59* 1.24*    Glucose  Recent Labs Lab 12/24/15 1440  GLUCAP 115*    Imaging Ct Abdomen Pelvis Wo Contrast  Result Date: 12/24/2015 CLINICAL DATA:  Acute onset of sepsis.  Initial encounter. EXAM: CT CHEST, ABDOMEN AND PELVIS WITHOUT CONTRAST TECHNIQUE: Multidetector CT imaging of the chest, abdomen and pelvis was performed following the standard protocol without IV contrast. COMPARISON:  None. FINDINGS: CT CHEST FINDINGS Cardiovascular: Scattered coronary artery calcifications are seen. Calcification is noted at the aortic valve. Mild calcification is noted at the great vessels. The thoracic aorta is grossly unremarkable in appearance. The heart is normal in size. Mediastinum/Nodes: The mediastinum is otherwise grossly unremarkable. No mediastinal lymphadenopathy is seen. No pericardial effusion is identified. The visualized portions of the thyroid gland are unremarkable. No axillary lymphadenopathy is seen. Lungs/Pleura: Trace bilateral pleural fluid is noted, with patchy bibasilar opacities likely reflecting atelectasis. No pneumothorax is identified. No dominant mass is seen. Evaluation is suboptimal due to motion artifact. Musculoskeletal: No acute osseous abnormalities are identified. The chest wall is grossly unremarkable in appearance. CT ABDOMEN PELVIS FINDINGS Hepatobiliary: The liver is unremarkable in appearance. The gallbladder is unremarkable in appearance. The common bile duct remains normal in caliber. Pancreas: The pancreas is within normal limits. Spleen: The spleen is unremarkable in appearance. Adrenals/Urinary Tract: The adrenal glands are unremarkable in appearance. Nonspecific perinephric stranding is noted bilaterally. Mild bilateral renal atrophy is noted. There is no evidence of hydronephrosis. No renal or ureteral stones are seen.  Stranding about the left renal pelvis could reflect mild left-sided pyelonephritis. Stomach/Bowel: The stomach is unremarkable in appearance. The small bowel is within normal limits. The appendix is not visualized; there is no evidence for appendicitis. Minimal diverticulosis is noted along the proximal sigmoid colon, without evidence of diverticulitis. Vascular/Lymphatic: Scattered calcification is seen along the abdominal aorta and its branches. The abdominal aorta is otherwise grossly unremarkable. The inferior vena cava is grossly unremarkable. Visualized retroperitoneal nodes remain borderline normal in size. No pelvic sidewall lymphadenopathy is identified. Reproductive: The prostate is enlarged, measuring 5.4 cm in transverse dimension, with impression on the base of the bladder. The bladder is mildly distended and grossly unremarkable. Other: No additional soft tissue abnormalities are seen. Musculoskeletal: No acute osseous abnormalities are identified. The visualized musculature is unremarkable in appearance. IMPRESSION: 1. Stranding about the left renal pelvis could reflect mild left-sided pyelonephritis. Would correlate with the patient's symptoms. 2. Trace bilateral pleural fluid, with patchy bibasilar airspace opacities likely reflecting atelectasis. Evaluation of the lung fields is suboptimal due to motion artifact. 3. Scattered coronary artery calcifications seen.  Calcification noted at the aortic valve. 4. Mild bilateral renal atrophy noted. 5. Minimal diverticulosis along the proximal sigmoid colon, without evidence of diverticulitis. 6. Scattered aortic atherosclerosis noted. 7. Enlarged prostate noted, with impression on the base of the bladder. Would correlate with PSA. Electronically Signed   By: Roanna RaiderJeffery  Chang M.D.   On: 12/24/2015 20:50   Ct Chest Wo Contrast  Result Date: 12/24/2015 CLINICAL DATA:  Acute onset of sepsis.  Initial encounter. EXAM: CT CHEST, ABDOMEN AND PELVIS WITHOUT  CONTRAST TECHNIQUE: Multidetector CT imaging of the chest, abdomen and pelvis was performed following the standard protocol without IV contrast. COMPARISON:  None. FINDINGS: CT CHEST FINDINGS Cardiovascular: Scattered coronary artery calcifications are seen. Calcification is noted at the aortic valve. Mild calcification is noted at the great vessels. The thoracic aorta is grossly unremarkable in appearance. The heart is normal in size. Mediastinum/Nodes: The mediastinum is otherwise grossly unremarkable. No mediastinal lymphadenopathy is seen. No pericardial effusion is identified. The visualized portions of the thyroid gland are unremarkable. No axillary lymphadenopathy is seen. Lungs/Pleura: Trace bilateral pleural fluid is noted, with patchy bibasilar opacities likely reflecting atelectasis. No pneumothorax is identified. No dominant mass is seen. Evaluation is suboptimal due to motion artifact. Musculoskeletal: No acute osseous abnormalities are identified. The chest wall is grossly unremarkable in appearance. CT ABDOMEN PELVIS FINDINGS Hepatobiliary: The liver is unremarkable in appearance. The gallbladder is unremarkable in appearance. The common bile duct remains normal in caliber. Pancreas: The pancreas is within normal limits. Spleen: The spleen is unremarkable in appearance. Adrenals/Urinary Tract: The adrenal glands are unremarkable in appearance. Nonspecific perinephric stranding is noted bilaterally. Mild bilateral renal atrophy is noted. There is no evidence of hydronephrosis. No renal or ureteral stones are seen. Stranding about the left renal pelvis could reflect mild left-sided pyelonephritis. Stomach/Bowel: The stomach is unremarkable in appearance. The small bowel is within normal limits. The appendix is not visualized; there is no evidence for appendicitis. Minimal diverticulosis is noted along the proximal sigmoid colon, without evidence of diverticulitis. Vascular/Lymphatic: Scattered  calcification is seen along the abdominal aorta and its branches. The abdominal aorta is otherwise grossly unremarkable. The inferior vena cava is grossly unremarkable. Visualized retroperitoneal nodes remain borderline normal in size. No pelvic sidewall lymphadenopathy is identified. Reproductive: The prostate is enlarged, measuring 5.4 cm in transverse dimension, with impression on the base of the bladder. The bladder is mildly distended and grossly unremarkable. Other: No additional soft tissue abnormalities are seen. Musculoskeletal: No acute osseous abnormalities are identified. The visualized musculature is unremarkable in appearance. IMPRESSION: 1. Stranding about the left renal pelvis could reflect mild left-sided pyelonephritis. Would correlate with the patient's symptoms. 2. Trace bilateral pleural fluid, with patchy bibasilar airspace opacities likely reflecting atelectasis. Evaluation of the lung fields is suboptimal due to motion artifact. 3. Scattered coronary artery calcifications seen. Calcification noted at the aortic valve. 4. Mild bilateral renal atrophy noted. 5. Minimal diverticulosis along the proximal sigmoid colon, without evidence of diverticulitis. 6. Scattered aortic atherosclerosis noted. 7. Enlarged prostate noted, with impression on the base of the bladder. Would correlate with PSA. Electronically Signed   By: Roanna RaiderJeffery  Chang M.D.   On: 12/24/2015 20:50   Dg Chest Port 1 View  Result Date: 12/24/2015 CLINICAL DATA:  Sepsis and hypoxia EXAM: PORTABLE CHEST 1 VIEW COMPARISON:  03/12/2013 FINDINGS: Mild cardiac enlargement. Low lung volumes. Pulmonary vascular congestion noted. No pleural effusions or edema. No airspace consolidation. IMPRESSION: 1. Mild cardiac enlargement and pulmonary vascular congestion. Electronically  Signed   By: Signa Kell M.D.   On: 12/24/2015 18:13     STUDIES:  CT chest, abd, pelvis 10/9 > stranding around the left kidney ? left pyelonephritis. Trace  bilateral effusions with atelectasis. No consolidation in the lungs. Images personally reviewed.  CULTURES: Bcx 10/9 > Ucx 10/9 >  ANTIBIOTICS: Vanco 10/9 > Zosyn 10/9 >  SIGNIFICANT EVENTS:   LINES/TUBES:   DISCUSSION: 82 year old with septic shock, presumed source is left pyelonephritis. There is no other source of infection on the CT of the abdomen and pelvis.  Chest x-ray shows mild vascular congestion so fluid bolus held at 3 L. We are continuing gentle fluid resuscitation at 75 mL per hour and lactate is improving again. We'll continue to monitor closely. If he worsens then we will transfer to the ICU for pressors. Plan discussed with daughter at bedside.  ASSESSMENT / PLAN:  PULMONARY A: Fluid overload P:   Continue supplemental O2 Use Bipap if needed for hypoxia and increased WOB  CARDIOVASCULAR A:  Septic shock H/O a fib Diastolic CHF Elevated troponins > ? demand P:  Follow TnI and echo Hold lasix  RENAL A:   Mild AKI in setting of shock, UTI P:   Monitor urine output and Cr  GASTROINTESTINAL A:   Stabke P:   Keep NPO  HEMATOLOGIC A:   Leukopenia. Likely from sepsis P:  Follow CBC  INFECTIOUS A:   UTI, pyelonephritis P:   Continue broad abx Follow Pct and cultures  ENDOCRINE A:   Stable   P:    NEUROLOGIC A:   Dementia Delirium P:   Continue aricept Haldol PRN.  FAMILY  - Updates: Daughter updated at bedside. Mr. Wadlow had a living will but she does not know what it states. We will assume he is full code till this can be clarified. - Inter-disciplinary family meet or Palliative Care meeting due by:  10/17  Critical care time- 35 mins.  Chilton Greathouse MD  Pulmonary and Critical Care Pager [phone] If no answer or after 3pm call: [phone] 12/25/2015, 9:23 AM

## 2015-12-25 NOTE — Progress Notes (Signed)
PHARMACY - PHYSICIAN COMMUNICATION CRITICAL VALUE ALERT - BLOOD CULTURE IDENTIFICATION (BCID)  Results for orders placed or performed during the hospital encounter of 12/24/15  Blood Culture ID Panel (Reflexed) (Collected: 12/24/2015  3:50 PM)  Result Value Ref Range   Enterococcus species NOT DETECTED NOT DETECTED   Listeria monocytogenes NOT DETECTED NOT DETECTED   Staphylococcus species NOT DETECTED NOT DETECTED   Staphylococcus aureus NOT DETECTED NOT DETECTED   Streptococcus species NOT DETECTED NOT DETECTED   Streptococcus agalactiae NOT DETECTED NOT DETECTED   Streptococcus pneumoniae NOT DETECTED NOT DETECTED   Streptococcus pyogenes NOT DETECTED NOT DETECTED   Acinetobacter baumannii NOT DETECTED NOT DETECTED   Enterobacteriaceae species NOT DETECTED NOT DETECTED   Enterobacter cloacae complex NOT DETECTED NOT DETECTED   Escherichia coli NOT DETECTED NOT DETECTED   Klebsiella oxytoca NOT DETECTED NOT DETECTED   Klebsiella pneumoniae NOT DETECTED NOT DETECTED   Proteus species NOT DETECTED NOT DETECTED   Serratia marcescens NOT DETECTED NOT DETECTED   Haemophilus influenzae NOT DETECTED NOT DETECTED   Neisseria meningitidis NOT DETECTED NOT DETECTED   Pseudomonas aeruginosa NOT DETECTED NOT DETECTED   Candida albicans NOT DETECTED NOT DETECTED   Candida glabrata NOT DETECTED NOT DETECTED   Candida krusei NOT DETECTED NOT DETECTED   Candida parapsilosis NOT DETECTED NOT DETECTED   Candida tropicalis NOT DETECTED NOT DETECTED   82 year old male admitted with suspected urosepsis. He now has 1 blood culture with Gram negative rods and nothing detected on BCID. This could be a variety of different organisms. He is currently receiving broad gram negative coverage with Zosyn.  He is also on Vancomycin.  Name of physician (or Provider) Contacted: Dr. Sunnie Nielsenegalado  Changes to prescribed antibiotics required:  Continuing Zosyn pending identification of organism.  Discontinue  Vancomycin. If he clinically deteriorates could consider changing Zosyn to Meropenem.  Another option is to add an aminoglycoside to Zosyn to broaden coverage but this is less attractive given his age, elevated serum creatinine, and risk of nephrotoxicity with an aminoglycoside.  Estella HuskMichelle Anhar Mcdermott, Pharm.D., BCPS, AAHIVP Clinical Pharmacist Phone: [phone] or [phone] Pager: [phone] 12/25/2015, 10:09 AM

## 2015-12-25 NOTE — Progress Notes (Signed)
MD Regalado notified of troponin 3.91, new order received to repeat troponin series & MD to consult cardiology.

## 2015-12-25 NOTE — Consult Note (Signed)
Urology Consult   Physician requesting consult: Praveen Mannam  Reason for consult: UTI with sepsis  History of Present Illness: Daryl Salas is a 82 y.o. male with PMH significant for glaucoma, HTN, hyperlipidemia, CAD, afib, diastolic dysfunction, BPH with BOO, and dementia who was admitted yesterday for sepsis thought to be due to UTI.  Per the pts daughter he developed bleeding from the penis and hypotension yesterday afternoon.  Prior to that he had been in his usual state of health.  He has dementia and is unable to provide any hx.  All information is obtained from his daughter who is active in his care.  She states he has had BPH for a "long time" but has never had any issues from this.  He is followed yearly by Dr. Annabell Howells.  He does not complain of LUTS at home and has not had previous episodes of hematuria that she is aware of.  She knows that his fluid intake is very low on a daily basis.   After admission he was found to have leukopenia, hypotension, LGF, Cr 1.23, and UA positive for TNTC RBCs with few leukocytes and nitrite negative. CT A/P showed stranding around the left renal pelvis which could represent pyelonephritis and an enlarged prostate.  After IVF his Cr improved to 1.15 and his BP has remained stable. Urine and blood cultures are pending. WBC 2.2 on admission went up to 14.9 today.  Troponin has been elevated and a cardiology consult is pending.   He appears to be resting comfortably but is confused.  He was last evaled by Dr. Annabell Howells in Nov 2016 at which time his BPH and BOO were stable on finasteride with a minimal PVR.  He was to f/u in a year.   Past Medical History:  Diagnosis Date  . Hyperlipidemia   . Hypertension   Glaucoma CAD Diastolic dysfunction BPH with BOO Afib dementia  Past Surgical History:  Procedure Laterality Date  . CARPAL TUNNEL RELEASE Right   . TOTAL KNEE ARTHROPLASTY Left 2005    Current Hospital Medications:  Scheduled Meds: .  donepezil  10 mg Oral Daily  . dorzolamide  1 drop Both Eyes Daily  . finasteride  5 mg Oral Daily  . multivitamin with minerals  1 tablet Oral Daily  . piperacillin-tazobactam (ZOSYN)  IV  3.375 g Intravenous Q8H  . potassium chloride  10 mEq Oral Daily  . simvastatin  20 mg Oral QPM  . timolol  1 drop Both Eyes BID   Continuous Infusions: . sodium chloride 75 mL/hr at 12/25/15 0416   PRN Meds:.acetaminophen **OR** acetaminophen, haloperidol lactate, ipratropium-albuterol, ondansetron **OR** ondansetron (ZOFRAN) IV  Allergies: No Known Allergies  History reviewed. No pertinent family history.  Social History:  reports that he has never smoked. He has never used smokeless tobacco. He reports that he does not drink alcohol or use drugs.  ROS: A complete review of systems could not be performed due to the pts mental status  Physical Exam:  Vital signs in last 24 hours: Temp:  [96.3 F (35.7 C)-100.8 F (38.2 C)] 99.2 F (37.3 C) (10/10 1140) Pulse Rate:  [82-125] 109 (10/10 1319) Resp:  [14-32] 20 (10/10 1319) BP: (86-177)/(47-100) 131/73 (10/10 1319) SpO2:  [93 %-100 %] 95 % (10/10 1319) Weight:  [125.2 kg (276 lb)] 125.2 kg (276 lb) (10/09 1600) Constitutional:  Alert; not oriented, No acute distress Cardiovascular: Regular rate and rhythm Respiratory: Normal respiratory effort GI: Abdomen is soft, nontender, nondistended, no  abdominal masses GU: No CVA tenderness; foley in place with cloudy dark yellow urine in bag; old blood noted around penis and cath Lymphatic: No lymphadenopathy Neurologic: Grossly intact, no focal deficits; dementia Psychiatric: Normal mood and affect  Laboratory Data:   Recent Labs  12/24/15 1513 12/25/15 1034  WBC 2.2* 14.9*  HGB 15.1 11.7*  HCT 47.3 36.0*  PLT 147* 106*     Recent Labs  12/24/15 1513 12/25/15 1034  NA 141 142  K 3.9 3.6  CL 105 110  GLUCOSE 129* 156*  BUN 16 19  CALCIUM 8.8* 7.8*  CREATININE 1.23 1.15      Results for orders placed or performed during the hospital encounter of 12/24/15 (from the past 24 hour(s))  CBG monitoring, ED     Status: Abnormal   Collection Time: 12/24/15  2:40 PM  Result Value Ref Range   Glucose-Capillary 115 (H) 65 - 99 mg/dL  Comprehensive metabolic panel     Status: Abnormal   Collection Time: 12/24/15  3:13 PM  Result Value Ref Range   Sodium 141 135 - 145 mmol/L   Potassium 3.9 3.5 - 5.1 mmol/L   Chloride 105 101 - 111 mmol/L   CO2 19 (L) 22 - 32 mmol/L   Glucose, Bld 129 (H) 65 - 99 mg/dL   BUN 16 6 - 20 mg/dL   Creatinine, Ser 1.61 0.61 - 1.24 mg/dL   Calcium 8.8 (L) 8.9 - 10.3 mg/dL   Total Protein 6.9 6.5 - 8.1 g/dL   Albumin 4.4 3.5 - 5.0 g/dL   AST 49 (H) 15 - 41 U/L   ALT 26 17 - 63 U/L   Alkaline Phosphatase 56 38 - 126 U/L   Total Bilirubin 1.9 (H) 0.3 - 1.2 mg/dL   GFR calc non Af Amer 54 (L) >60 mL/min   GFR calc Af Amer >60 >60 mL/min   Anion gap 17 (H) 5 - 15  CBC with Differential     Status: Abnormal   Collection Time: 12/24/15  3:13 PM  Result Value Ref Range   WBC 2.2 (L) 4.0 - 10.5 K/uL   RBC 5.03 4.22 - 5.81 MIL/uL   Hemoglobin 15.1 13.0 - 17.0 g/dL   HCT 09.6 04.5 - 40.9 %   MCV 94.0 78.0 - 100.0 fL   MCH 30.0 26.0 - 34.0 pg   MCHC 31.9 30.0 - 36.0 g/dL   RDW 81.1 91.4 - 78.2 %   Platelets 147 (L) 150 - 400 K/uL   Neutrophils Relative % 80 %   Neutro Abs 1.8 1.7 - 7.7 K/uL   Lymphocytes Relative 16 %   Lymphs Abs 0.3 (L) 0.7 - 4.0 K/uL   Monocytes Relative 1 %   Monocytes Absolute 0.0 (L) 0.1 - 1.0 K/uL   Eosinophils Relative 2 %   Eosinophils Absolute 0.1 0.0 - 0.7 K/uL   Basophils Relative 1 %   Basophils Absolute 0.0 0.0 - 0.1 K/uL  Culture, blood (Routine x 2)     Status: None (Preliminary result)   Collection Time: 12/24/15  3:20 PM  Result Value Ref Range   Specimen Description BLOOD RIGHT HAND    Special Requests BOTTLES DRAWN AEROBIC AND ANAEROBIC 5CC    Culture NO GROWTH < 24 HOURS    Report Status  PENDING   I-Stat beta hCG blood, ED     Status: None   Collection Time: 12/24/15  3:22 PM  Result Value Ref Range   I-stat hCG, quantitative <  5.0 <5 mIU/mL   Comment 3          I-Stat CG4 Lactic Acid, ED     Status: Abnormal   Collection Time: 12/24/15  3:24 PM  Result Value Ref Range   Lactic Acid, Venous 8.77 (HH) 0.5 - 1.9 mmol/L   Comment NOTIFIED PHYSICIAN   Culture, blood (Routine x 2)     Status: None (Preliminary result)   Collection Time: 12/24/15  3:50 PM  Result Value Ref Range   Specimen Description BLOOD LEFT HAND    Special Requests IN PEDIATRIC BOTTLE 2CC    Culture  Setup Time      GRAM NEGATIVE RODS IN PEDIATRIC BOTTLE Organism ID to follow CRITICAL RESULT CALLED TO, READ BACK BY AND VERIFIED WITH: MLoretha Brasil.D. 9:50 12/25/15 ([email or web address])    Culture NO GROWTH < 24 HOURS    Report Status PENDING   Blood Culture ID Panel (Reflexed)     Status: None   Collection Time: 12/24/15  3:50 PM  Result Value Ref Range   Enterococcus species NOT DETECTED NOT DETECTED   Listeria monocytogenes NOT DETECTED NOT DETECTED   Staphylococcus species NOT DETECTED NOT DETECTED   Staphylococcus aureus NOT DETECTED NOT DETECTED   Streptococcus species NOT DETECTED NOT DETECTED   Streptococcus agalactiae NOT DETECTED NOT DETECTED   Streptococcus pneumoniae NOT DETECTED NOT DETECTED   Streptococcus pyogenes NOT DETECTED NOT DETECTED   Acinetobacter baumannii NOT DETECTED NOT DETECTED   Enterobacteriaceae species NOT DETECTED NOT DETECTED   Enterobacter cloacae complex NOT DETECTED NOT DETECTED   Escherichia coli NOT DETECTED NOT DETECTED   Klebsiella oxytoca NOT DETECTED NOT DETECTED   Klebsiella pneumoniae NOT DETECTED NOT DETECTED   Proteus species NOT DETECTED NOT DETECTED   Serratia marcescens NOT DETECTED NOT DETECTED   Haemophilus influenzae NOT DETECTED NOT DETECTED   Neisseria meningitidis NOT DETECTED NOT DETECTED   Pseudomonas aeruginosa NOT DETECTED NOT DETECTED    Candida albicans NOT DETECTED NOT DETECTED   Candida glabrata NOT DETECTED NOT DETECTED   Candida krusei NOT DETECTED NOT DETECTED   Candida parapsilosis NOT DETECTED NOT DETECTED   Candida tropicalis NOT DETECTED NOT DETECTED  Urinalysis, Routine w reflex microscopic     Status: Abnormal   Collection Time: 12/24/15  5:30 PM  Result Value Ref Range   Color, Urine AMBER (A) YELLOW   APPearance CLOUDY (A) CLEAR   Specific Gravity, Urine 1.012 1.005 - 1.030   pH 6.0 5.0 - 8.0   Glucose, UA NEGATIVE NEGATIVE mg/dL   Hgb urine dipstick LARGE (A) NEGATIVE   Bilirubin Urine NEGATIVE NEGATIVE   Ketones, ur NEGATIVE NEGATIVE mg/dL   Protein, ur 30 (A) NEGATIVE mg/dL   Nitrite NEGATIVE NEGATIVE   Leukocytes, UA SMALL (A) NEGATIVE  Urine microscopic-add on     Status: Abnormal   Collection Time: 12/24/15  5:30 PM  Result Value Ref Range   Squamous Epithelial / LPF 0-5 (A) NONE SEEN   WBC, UA 0-5 0 - 5 WBC/hpf   RBC / HPF TOO NUMEROUS TO COUNT 0 - 5 RBC/hpf   Bacteria, UA RARE (A) NONE SEEN   Casts HYALINE CASTS (A) NEGATIVE  I-Stat CG4 Lactic Acid, ED     Status: Abnormal   Collection Time: 12/24/15  6:53 PM  Result Value Ref Range   Lactic Acid, Venous 4.36 (HH) 0.5 - 1.9 mmol/L   Comment NOTIFIED PHYSICIAN   Protime-INR     Status: Abnormal   Collection Time: 12/25/15  1:41 AM  Result Value Ref Range   Prothrombin Time 17.7 (H) 11.4 - 15.2 seconds   INR 1.44   APTT     Status: None   Collection Time: 12/25/15  1:41 AM  Result Value Ref Range   aPTT 29 24 - 36 seconds  Troponin I (q 6hr x 3)     Status: Abnormal   Collection Time: 12/25/15  1:41 AM  Result Value Ref Range   Troponin I 0.59 (HH) <0.03 ng/mL  Brain natriuretic peptide     Status: Abnormal   Collection Time: 12/25/15  1:41 AM  Result Value Ref Range   B Natriuretic Peptide 474.0 (H) 0.0 - 100.0 pg/mL  Lactic acid, plasma     Status: Abnormal   Collection Time: 12/25/15  2:10 AM  Result Value Ref Range   Lactic  Acid, Venous 7.2 (HH) 0.5 - 1.9 mmol/L  TSH     Status: None   Collection Time: 12/25/15  2:10 AM  Result Value Ref Range   TSH 1.154 0.350 - 4.500 uIU/mL  MRSA PCR Screening     Status: None   Collection Time: 12/25/15  4:10 AM  Result Value Ref Range   MRSA by PCR NEGATIVE NEGATIVE  Troponin I (q 6hr x 3)     Status: Abnormal   Collection Time: 12/25/15  5:14 AM  Result Value Ref Range   Troponin I 1.24 (HH) <0.03 ng/mL  Lactic acid, plasma     Status: Abnormal   Collection Time: 12/25/15  7:56 AM  Result Value Ref Range   Lactic Acid, Venous 4.7 (HH) 0.5 - 1.9 mmol/L  Ammonia     Status: None   Collection Time: 12/25/15  7:58 AM  Result Value Ref Range   Ammonia 18 9 - 35 umol/L  Troponin I (q 6hr x 3)     Status: Abnormal   Collection Time: 12/25/15 10:34 AM  Result Value Ref Range   Troponin I 3.91 (HH) <0.03 ng/mL  CBC     Status: Abnormal   Collection Time: 12/25/15 10:34 AM  Result Value Ref Range   WBC 14.9 (H) 4.0 - 10.5 K/uL   RBC 3.94 (L) 4.22 - 5.81 MIL/uL   Hemoglobin 11.7 (L) 13.0 - 17.0 g/dL   HCT 29.536.0 (L) 62.139.0 - 30.852.0 %   MCV 91.4 78.0 - 100.0 fL   MCH 29.7 26.0 - 34.0 pg   MCHC 32.5 30.0 - 36.0 g/dL   RDW 65.713.9 84.611.5 - 96.215.5 %   Platelets 106 (L) 150 - 400 K/uL  Comprehensive metabolic panel     Status: Abnormal   Collection Time: 12/25/15 10:34 AM  Result Value Ref Range   Sodium 142 135 - 145 mmol/L   Potassium 3.6 3.5 - 5.1 mmol/L   Chloride 110 101 - 111 mmol/L   CO2 23 22 - 32 mmol/L   Glucose, Bld 156 (H) 65 - 99 mg/dL   BUN 19 6 - 20 mg/dL   Creatinine, Ser 9.521.15 0.61 - 1.24 mg/dL   Calcium 7.8 (L) 8.9 - 10.3 mg/dL   Total Protein 5.8 (L) 6.5 - 8.1 g/dL   Albumin 3.2 (L) 3.5 - 5.0 g/dL   AST 94 (H) 15 - 41 U/L   ALT 61 17 - 63 U/L   Alkaline Phosphatase 37 (L) 38 - 126 U/L   Total Bilirubin 1.4 (H) 0.3 - 1.2 mg/dL   GFR calc non Af Amer 59 (L) >60 mL/min   GFR  calc Af Amer >60 >60 mL/min   Anion gap 9 5 - 15  Lactic acid, plasma     Status:  Abnormal   Collection Time: 12/25/15 10:34 AM  Result Value Ref Range   Lactic Acid, Venous 3.3 (HH) 0.5 - 1.9 mmol/L   Recent Results (from the past 240 hour(s))  Culture, blood (Routine x 2)     Status: None (Preliminary result)   Collection Time: 12/24/15  3:20 PM  Result Value Ref Range Status   Specimen Description BLOOD RIGHT HAND  Final   Special Requests BOTTLES DRAWN AEROBIC AND ANAEROBIC 5CC  Final   Culture NO GROWTH < 24 HOURS  Final   Report Status PENDING  Incomplete  Culture, blood (Routine x 2)     Status: None (Preliminary result)   Collection Time: 12/24/15  3:50 PM  Result Value Ref Range Status   Specimen Description BLOOD LEFT HAND  Final   Special Requests IN PEDIATRIC BOTTLE 2CC  Final   Culture  Setup Time   Final    GRAM NEGATIVE RODS IN PEDIATRIC BOTTLE Organism ID to follow CRITICAL RESULT CALLED TO, READ BACK BY AND VERIFIED WITH: MLoretha Brasil.D. 9:50 12/25/15 ([email or web address])    Culture NO GROWTH < 24 HOURS  Final   Report Status PENDING  Incomplete  Blood Culture ID Panel (Reflexed)     Status: None   Collection Time: 12/24/15  3:50 PM  Result Value Ref Range Status   Enterococcus species NOT DETECTED NOT DETECTED Final   Listeria monocytogenes NOT DETECTED NOT DETECTED Final   Staphylococcus species NOT DETECTED NOT DETECTED Final   Staphylococcus aureus NOT DETECTED NOT DETECTED Final   Streptococcus species NOT DETECTED NOT DETECTED Final   Streptococcus agalactiae NOT DETECTED NOT DETECTED Final   Streptococcus pneumoniae NOT DETECTED NOT DETECTED Final   Streptococcus pyogenes NOT DETECTED NOT DETECTED Final   Acinetobacter baumannii NOT DETECTED NOT DETECTED Final   Enterobacteriaceae species NOT DETECTED NOT DETECTED Final   Enterobacter cloacae complex NOT DETECTED NOT DETECTED Final   Escherichia coli NOT DETECTED NOT DETECTED Final   Klebsiella oxytoca NOT DETECTED NOT DETECTED Final   Klebsiella pneumoniae NOT DETECTED NOT DETECTED Final    Proteus species NOT DETECTED NOT DETECTED Final   Serratia marcescens NOT DETECTED NOT DETECTED Final   Haemophilus influenzae NOT DETECTED NOT DETECTED Final   Neisseria meningitidis NOT DETECTED NOT DETECTED Final   Pseudomonas aeruginosa NOT DETECTED NOT DETECTED Final   Candida albicans NOT DETECTED NOT DETECTED Final   Candida glabrata NOT DETECTED NOT DETECTED Final   Candida krusei NOT DETECTED NOT DETECTED Final   Candida parapsilosis NOT DETECTED NOT DETECTED Final   Candida tropicalis NOT DETECTED NOT DETECTED Final  MRSA PCR Screening     Status: None   Collection Time: 12/25/15  4:10 AM  Result Value Ref Range Status   MRSA by PCR NEGATIVE NEGATIVE Final    Comment:        The GeneXpert MRSA Assay (FDA approved for NASAL specimens only), is one component of a comprehensive MRSA colonization surveillance program. It is not intended to diagnose MRSA infection nor to guide or monitor treatment for MRSA infections.     Renal Function:  Recent Labs  12/24/15 1513 12/25/15 1034  CREATININE 1.23 1.15   Estimated Creatinine Clearance: 70.9 mL/min (by C-G formula based on SCr of 1.15 mg/dL).  Radiologic Imaging: Ct Abdomen Pelvis Wo Contrast  Result Date: 12/24/2015 CLINICAL DATA:  Acute  onset of sepsis.  Initial encounter. EXAM: CT CHEST, ABDOMEN AND PELVIS WITHOUT CONTRAST TECHNIQUE: Multidetector CT imaging of the chest, abdomen and pelvis was performed following the standard protocol without IV contrast. COMPARISON:  None. FINDINGS: CT CHEST FINDINGS Cardiovascular: Scattered coronary artery calcifications are seen. Calcification is noted at the aortic valve. Mild calcification is noted at the great vessels. The thoracic aorta is grossly unremarkable in appearance. The heart is normal in size. Mediastinum/Nodes: The mediastinum is otherwise grossly unremarkable. No mediastinal lymphadenopathy is seen. No pericardial effusion is identified. The visualized portions of  the thyroid gland are unremarkable. No axillary lymphadenopathy is seen. Lungs/Pleura: Trace bilateral pleural fluid is noted, with patchy bibasilar opacities likely reflecting atelectasis. No pneumothorax is identified. No dominant mass is seen. Evaluation is suboptimal due to motion artifact. Musculoskeletal: No acute osseous abnormalities are identified. The chest wall is grossly unremarkable in appearance. CT ABDOMEN PELVIS FINDINGS Hepatobiliary: The liver is unremarkable in appearance. The gallbladder is unremarkable in appearance. The common bile duct remains normal in caliber. Pancreas: The pancreas is within normal limits. Spleen: The spleen is unremarkable in appearance. Adrenals/Urinary Tract: The adrenal glands are unremarkable in appearance. Nonspecific perinephric stranding is noted bilaterally. Mild bilateral renal atrophy is noted. There is no evidence of hydronephrosis. No renal or ureteral stones are seen. Stranding about the left renal pelvis could reflect mild left-sided pyelonephritis. Stomach/Bowel: The stomach is unremarkable in appearance. The small bowel is within normal limits. The appendix is not visualized; there is no evidence for appendicitis. Minimal diverticulosis is noted along the proximal sigmoid colon, without evidence of diverticulitis. Vascular/Lymphatic: Scattered calcification is seen along the abdominal aorta and its branches. The abdominal aorta is otherwise grossly unremarkable. The inferior vena cava is grossly unremarkable. Visualized retroperitoneal nodes remain borderline normal in size. No pelvic sidewall lymphadenopathy is identified. Reproductive: The prostate is enlarged, measuring 5.4 cm in transverse dimension, with impression on the base of the bladder. The bladder is mildly distended and grossly unremarkable. Other: No additional soft tissue abnormalities are seen. Musculoskeletal: No acute osseous abnormalities are identified. The visualized musculature is  unremarkable in appearance. IMPRESSION: 1. Stranding about the left renal pelvis could reflect mild left-sided pyelonephritis. Would correlate with the patient's symptoms. 2. Trace bilateral pleural fluid, with patchy bibasilar airspace opacities likely reflecting atelectasis. Evaluation of the lung fields is suboptimal due to motion artifact. 3. Scattered coronary artery calcifications seen. Calcification noted at the aortic valve. 4. Mild bilateral renal atrophy noted. 5. Minimal diverticulosis along the proximal sigmoid colon, without evidence of diverticulitis. 6. Scattered aortic atherosclerosis noted. 7. Enlarged prostate noted, with impression on the base of the bladder. Would correlate with PSA. Electronically Signed   By: Roanna Raider M.D.   On: 12/24/2015 20:50   Ct Chest Wo Contrast  Result Date: 12/24/2015 CLINICAL DATA:  Acute onset of sepsis.  Initial encounter. EXAM: CT CHEST, ABDOMEN AND PELVIS WITHOUT CONTRAST TECHNIQUE: Multidetector CT imaging of the chest, abdomen and pelvis was performed following the standard protocol without IV contrast. COMPARISON:  None. FINDINGS: CT CHEST FINDINGS Cardiovascular: Scattered coronary artery calcifications are seen. Calcification is noted at the aortic valve. Mild calcification is noted at the great vessels. The thoracic aorta is grossly unremarkable in appearance. The heart is normal in size. Mediastinum/Nodes: The mediastinum is otherwise grossly unremarkable. No mediastinal lymphadenopathy is seen. No pericardial effusion is identified. The visualized portions of the thyroid gland are unremarkable. No axillary lymphadenopathy is seen. Lungs/Pleura: Trace bilateral pleural fluid is  noted, with patchy bibasilar opacities likely reflecting atelectasis. No pneumothorax is identified. No dominant mass is seen. Evaluation is suboptimal due to motion artifact. Musculoskeletal: No acute osseous abnormalities are identified. The chest wall is grossly  unremarkable in appearance. CT ABDOMEN PELVIS FINDINGS Hepatobiliary: The liver is unremarkable in appearance. The gallbladder is unremarkable in appearance. The common bile duct remains normal in caliber. Pancreas: The pancreas is within normal limits. Spleen: The spleen is unremarkable in appearance. Adrenals/Urinary Tract: The adrenal glands are unremarkable in appearance. Nonspecific perinephric stranding is noted bilaterally. Mild bilateral renal atrophy is noted. There is no evidence of hydronephrosis. No renal or ureteral stones are seen. Stranding about the left renal pelvis could reflect mild left-sided pyelonephritis. Stomach/Bowel: The stomach is unremarkable in appearance. The small bowel is within normal limits. The appendix is not visualized; there is no evidence for appendicitis. Minimal diverticulosis is noted along the proximal sigmoid colon, without evidence of diverticulitis. Vascular/Lymphatic: Scattered calcification is seen along the abdominal aorta and its branches. The abdominal aorta is otherwise grossly unremarkable. The inferior vena cava is grossly unremarkable. Visualized retroperitoneal nodes remain borderline normal in size. No pelvic sidewall lymphadenopathy is identified. Reproductive: The prostate is enlarged, measuring 5.4 cm in transverse dimension, with impression on the base of the bladder. The bladder is mildly distended and grossly unremarkable. Other: No additional soft tissue abnormalities are seen. Musculoskeletal: No acute osseous abnormalities are identified. The visualized musculature is unremarkable in appearance. IMPRESSION: 1. Stranding about the left renal pelvis could reflect mild left-sided pyelonephritis. Would correlate with the patient's symptoms. 2. Trace bilateral pleural fluid, with patchy bibasilar airspace opacities likely reflecting atelectasis. Evaluation of the lung fields is suboptimal due to motion artifact. 3. Scattered coronary artery calcifications  seen. Calcification noted at the aortic valve. 4. Mild bilateral renal atrophy noted. 5. Minimal diverticulosis along the proximal sigmoid colon, without evidence of diverticulitis. 6. Scattered aortic atherosclerosis noted. 7. Enlarged prostate noted, with impression on the base of the bladder. Would correlate with PSA. Electronically Signed   By: Roanna Raider M.D.   On: 12/24/2015 20:50   Dg Chest Port 1 View  Result Date: 12/24/2015 CLINICAL DATA:  Sepsis and hypoxia EXAM: PORTABLE CHEST 1 VIEW COMPARISON:  03/12/2013 FINDINGS: Mild cardiac enlargement. Low lung volumes. Pulmonary vascular congestion noted. No pleural effusions or edema. No airspace consolidation. IMPRESSION: 1. Mild cardiac enlargement and pulmonary vascular congestion. Electronically Signed   By: Signa Kell M.D.   On: 12/24/2015 18:13     Impression/Recommendation  Sepsis due to UTI with possible pyelonephritis and prostatitis--continue broad spectum IV ABx until culture results avail for review then adjust as necessary. Hematuria due to cystitis and possible prostatitis is resolving.  Continue foley until infection and hematuria have resolved.  Irrigate foley prn.  Continue finasteride.  Cr improving with IVF.  He has had a 4g drop in H/H (15/47 --> 11/36) since yesterday.  This is partly due to hemodilution but should be monitored closely. Per IM    Averiana Clouatre 12/25/2015, 2:00 PM

## 2015-12-25 NOTE — Progress Notes (Signed)
[facility]. Vital signs stable with some hypotension noted on admission (86/60 resolved to 104/58). Pt is confused and has dementia at baseline. Daughter is at bedside. Will continue to monitor.

## 2015-12-25 NOTE — Progress Notes (Signed)
CRITICAL VALUE ALERT  Critical value received:  Lactic Acid 2.3  Date of notification:  12/25/15  Time of notification:  1534  Critical value read back:Yes.    Nurse who received alert:  Ronna PolioJennifer Shalan Neault  MD notified (1st page):  Regal[place]  Time of first page:  1534  MD notified (2nd page):  Time of second page:  Responding MD:  Sunnie Nielsenegalado  Time MD responded:  1540

## 2015-12-25 NOTE — Progress Notes (Signed)
CRITICAL VALUE ALERT  Critical value received:  Lactic Acid 3.3  Date of notification:  12/25/15  Time of notification:  1126  Critical value read back:Yes.    Nurse who received alert:  Daryl PolioJennifer Ijeoma Loor  MD notified (1st page):  Regalado  Time of first page:  1127  MD notified (2nd page):  Time of second page:  Responding MD:  Daryl Salas  Time MD responded:  1129

## 2015-12-25 NOTE — Evaluation (Signed)
Clinical/Bedside Swallow Evaluation Patient Details  Name: Daryl Salas MRN: [id number] Date of Birth: 09/26/1933  Today's Date: 12/25/2015 Time: SLP Start Time (ACUTE ONLY): 1355 SLP Stop Time (ACUTE ONLY): 1410 SLP Time Calculation (min) (ACUTE ONLY): 15 min  Past Medical History:  Past Medical History:  Diagnosis Date  . Hyperlipidemia   . Hypertension    Past Surgical History:  Past Surgical History:  Procedure Laterality Date  . CARPAL TUNNEL RELEASE Right   . TOTAL KNEE ARTHROPLASTY Left 2005   HPI:  82 y.o. male admitted with sepsis, ARF, hematuria, possible afib.  CHF with possible acute exacerbation, baseline dementia.    Assessment / Plan / Recommendation Clinical Impression  Pt presents with normal oropharyngeal swallow marked by adequate mastication, brisk swallow response, no overt s/s of aspiration.  Despite dementia, he eats well and has a good appetite per dtr.  Spoke with daughter about dementia and its eventual impact upon swallowing, including oral deficits, aspiration, weight loss, and concerns for pna.  We discussed PEGs and their contraindication in advanced dementia.  I emphasized pt's excellent swallowing ability today, and encouraged pt's daughter to speak with her mother about these issues when appropriate.  She verbalized understanding.  Recommend regular diet, thin liquids.  No SLP f/u is needed - our services will sign off.     Aspiration Risk  No limitations    Diet Recommendation     Medication Administration: Whole meds with liquid    Other  Recommendations Oral Care Recommendations: Oral care BID   Follow up Recommendations None      Frequency and Duration            Prognosis        Swallow Study   General HPI: 82 y.o. male admitted with sepsis, ARF, hematuria, possible afib.  CHF with possible acute exacerbation, baseline dementia.  Type of Study: Bedside Swallow Evaluation Diet Prior to this Study: NPO Temperature Spikes  Noted: Yes Respiratory Status: Nasal cannula History of Recent Intubation: No Behavior/Cognition: Alert;Confused Oral Cavity Assessment: Within Functional Limits Oral Care Completed by SLP: Yes Oral Cavity - Dentition: Dentures, top;Dentures, bottom Vision: Functional for self-feeding Self-Feeding Abilities: Able to feed self Patient Positioning: Upright in bed Baseline Vocal Quality: Normal Volitional Cough: Strong Volitional Swallow: Able to elicit    Oral/Motor/Sensory Function Overall Oral Motor/Sensory Function: Within functional limits   Ice Chips Ice chips: Within functional limits   Thin Liquid Thin Liquid: Within functional limits    Nectar Thick Nectar Thick Liquid: Not tested   Honey Thick Honey Thick Liquid: Not tested   Puree Puree: Within functional limits   Solid   GO   Solid: Within functional limits       Daryl Alling L. Samson Fredericouture, MA CCC/SLP Pager [phone]  Blenda MountsCouture, Zayleigh Stroh Laurice 12/25/2015,3:33 PM

## 2015-12-25 NOTE — ED Notes (Signed)
Patient becoming more agitated, reaching for cords and flailing his legs and arms. Pt's daughter at bedside, helping to keep pt calm, but pt consistently becomes agitated again. MD made aware and put order for Geodon in.

## 2015-12-25 NOTE — Progress Notes (Addendum)
PROGRESS NOTE    Daryl Salas  ZOX:[id number] DOB: 02/02/1934 DOA: 12/24/2015 PCP: Mickie Hillier, MD   Brief Narrative: Daryl Salas is a 82 y.o. male with medical history significant of HTN, CHF, HLD, BPH; who presents with complaints of bleeding from penis and altered mental status. History is obtained from the wife and daughter as the patient is acutely confused more than usual and unable to provide his own. Wife states that she saw the patient was bleeding from the tip his penis is a well urinating. Patient denied scratching his penis or having any other trauma to her knowledge. They took him to the urgent care while and while there the patient started yawning. It was unusual as the patient continued to do this and became pale and diaphoretic. The daughter had nursing care at urgent care facility check his blood pressure and states that his systolic BPs were in 70s. Over the last few days the family notes that the patient had had decreased by mouth intake and question if he was dehydrated. Family notes that the patient has been followed by Dr Viann Fish of cardiology and had previous echocardiograms in the past. They are not aware of his current heart function.  ED Course: Upon admission into the emergency department patient was seen to be hypothermic temperature of 96.27F then febrile up to 100.8, heart rates 109-125, respirations 14-31, blood pressure maintained 102/57/170 7/69, O2 saturations 18 on 3 L of nasal cannula oxygen. Lab work revealed WBC 2.2, hemoglobin 15.1, platelets 147, sodium 141, potassium 3.9, chloride 105, CO2 19, BUN 16, creatinine 1.22, lactic acid 8.77. Patient was given 3 L of IV fluids in the ED with vancomycin and Zosyn per sepsis protocol. However, patient required nasal cannula oxygen to maintain O2 saturations. In out catheter revealed initially for bloody then turn pinkish urine. TRH called to admit to stepdown.  Assessment & Plan:   Principal  Problem:   Sepsis (HCC) Active Problems:   Acute encephalopathy   BPH (benign prostatic hyperplasia)   Hematuria   Atrial fibrillation (HCC)   1-Sepsis, source could be UTI, CT finding consistent with Pyelonephritis.  Lactic acid continue to be elevated at 7. SBP in the 90. He has received 3 L IV fluids.  Patient has history of diastolic HF. Will hold lasix due to concern for sepsis.  Continue with IV Antibiotics, IV fluids.  CCM consulted, patient at high risk for requiring IV pressors. Follow blood culture.  Continue to trend lactic acid.   2-Acute Respiratory Failure: At baseline patient not on oxygen at home patient required at least 3 L nasal cannula oxygen to maintain O2 sats following IV fluid hydration for sepsis protocol. - continue with oxygen supplementation.  -PRN Nebulize treatments.   Elevation of troponin;  Denies chest pain.  Continue to cycle.  Check ECHO.  Repeat EKG.  Troponin increasing. Discussed with cardiology , start heparin, cycle Hb, monitor hematuria.  Discussed with Dr Donnie Aho , ok to continue with heparin, if hematuria gets worse ok to hold heparin. Repeat EKG in am. And  get ECHO>   Hematuria: Acute.No previous history of this time per family. Possible obstruction secondary to bleeding. Patient sees Dr. Wilson Singer of Urology as outpatient - Place three-way Foley catheter with coude tip for BPH - Monitor bleeding from meatus -Will consult Urology, patient continue to have blood coming out from meatus.   Possible atrial fibrillation: Acute. no previous history although cannot be completely sure given a lot of artifact  on EKG.  - continue to monitor -TSH norma.  - repeat EKG. -Hold metoprolol due to hypotension.   Essential hypertension: Hold benazepril and metropolol   Congestive heart failure with possible acute exacerbation :  Patient was initially given 3 L of IV fluids for sepsis. Patient requiring nasal cannula oxygen of 3 L to maintain O2  saturations with some tachypnea. Chest x-ray showing cardiomegaly with signs of central congestion possibly after first liter of fluids. - Strict In and Outs respiratory status stable, would hold lasix due to sepsis.   Acute encephalopathy/Dementia with behavioral disturbance  - haldol help with agitation. Will order PRN haldol.  - Continued donepezil once able - Changed patient to NPO, speech therapy to eval and treat  BPH -  Continue Proscar once able    DVT prophylaxis: SCD Code Status: Full code.  Family Communication: Daughter at bedside.  Disposition Plan: remain inpatient   Consultants:   CCM  Procedures:   ECHO ; pending/   Antimicrobials:  Zosyn 10-09. Vancomycin    Subjective: He is calm, received haldol early this morning.  He is confused, he has dementia. He denies chest pain, dyspnea, abdominal pain.  No further bleeding from meatus.   Objective: Vitals:   12/25/15 0200 12/25/15 0230 12/25/15 0300 12/25/15 0408  BP: 107/63 101/66 115/58 (!) 86/60  Pulse: 109 109 111 (!) 107  Resp: (!) 27 (!) 31 (!) 32 (!) 25  Temp:    98.7 F (37.1 C)  TempSrc:    Oral  SpO2: 98% 96% 97% 96%  Weight:      Height:        Intake/Output Summary (Last 24 hours) at 12/25/15 0730 Last data filed at 12/25/15 0500  Gross per 24 hour  Intake             3605 ml  Output              825 ml  Net             2780 ml   Filed Weights   12/24/15 1600  Weight: 125.2 kg (276 lb)    Examination:  General exam: Appears calm and comfortable  Respiratory system: Clear to auscultation. Respiratory effort normal. Cardiovascular system: S1 & S2 heard, RRR. No JVD, murmurs, rubs, gallops or clicks. No pedal edema. Gastrointestinal system: Abdomen is nondistended, soft and nontender. No organomegaly or masses felt. Normal bowel sounds heard. Central nervous system: Alert and oriented. Unable to perform due to AMS Extremities: Symmetric 5 x 5 power. Skin: No rashes, lesions  or ulcers Psychiatry: now calm, was agitated overnight     Data Reviewed: I have personally reviewed following labs and imaging studies  CBC:  Recent Labs Lab 12/24/15 1513  WBC 2.2*  NEUTROABS 1.8  HGB 15.1  HCT 47.3  MCV 94.0  PLT 147*   Basic Metabolic Panel:  Recent Labs Lab 12/24/15 1513  NA 141  K 3.9  CL 105  CO2 19*  GLUCOSE 129*  BUN 16  CREATININE 1.23  CALCIUM 8.8*   GFR: Estimated Creatinine Clearance: 66.3 mL/min (by C-G formula based on SCr of 1.23 mg/dL). Liver Function Tests:  Recent Labs Lab 12/24/15 1513  AST 49*  ALT 26  ALKPHOS 56  BILITOT 1.9*  PROT 6.9  ALBUMIN 4.4   No results for input(s): LIPASE, AMYLASE in the last 168 hours. No results for input(s): AMMONIA in the last 168 hours. Coagulation Profile:  Recent Labs Lab 12/25/15 0141  INR 1.44   Cardiac Enzymes:  Recent Labs Lab 12/25/15 0141 12/25/15 0514  TROPONINI 0.59* 1.24*   BNP (last 3 results) No results for input(s): PROBNP in the last 8760 hours. HbA1C: No results for input(s): HGBA1C in the last 72 hours. CBG:  Recent Labs Lab 12/24/15 1440  GLUCAP 115*   Lipid Profile: No results for input(s): CHOL, HDL, LDLCALC, TRIG, CHOLHDL, LDLDIRECT in the last 72 hours. Thyroid Function Tests:  Recent Labs  12/25/15 0210  TSH 1.154   Anemia Panel: No results for input(s): VITAMINB12, FOLATE, FERRITIN, TIBC, IRON, RETICCTPCT in the last 72 hours. Sepsis Labs:  Recent Labs Lab 12/24/15 1524 12/24/15 1853 12/25/15 0210  LATICACIDVEN 8.77* 4.36* 7.2*    Recent Results (from the past 240 hour(s))  MRSA PCR Screening     Status: None   Collection Time: 12/25/15  4:10 AM  Result Value Ref Range Status   MRSA by PCR NEGATIVE NEGATIVE Final    Comment:        The GeneXpert MRSA Assay (FDA approved for NASAL specimens only), is one component of a comprehensive MRSA colonization surveillance program. It is not intended to diagnose MRSA infection  nor to guide or monitor treatment for MRSA infections.          Radiology Studies: Ct Abdomen Pelvis Wo Contrast  Result Date: 12/24/2015 CLINICAL DATA:  Acute onset of sepsis.  Initial encounter. EXAM: CT CHEST, ABDOMEN AND PELVIS WITHOUT CONTRAST TECHNIQUE: Multidetector CT imaging of the chest, abdomen and pelvis was performed following the standard protocol without IV contrast. COMPARISON:  None. FINDINGS: CT CHEST FINDINGS Cardiovascular: Scattered coronary artery calcifications are seen. Calcification is noted at the aortic valve. Mild calcification is noted at the great vessels. The thoracic aorta is grossly unremarkable in appearance. The heart is normal in size. Mediastinum/Nodes: The mediastinum is otherwise grossly unremarkable. No mediastinal lymphadenopathy is seen. No pericardial effusion is identified. The visualized portions of the thyroid gland are unremarkable. No axillary lymphadenopathy is seen. Lungs/Pleura: Trace bilateral pleural fluid is noted, with patchy bibasilar opacities likely reflecting atelectasis. No pneumothorax is identified. No dominant mass is seen. Evaluation is suboptimal due to motion artifact. Musculoskeletal: No acute osseous abnormalities are identified. The chest wall is grossly unremarkable in appearance. CT ABDOMEN PELVIS FINDINGS Hepatobiliary: The liver is unremarkable in appearance. The gallbladder is unremarkable in appearance. The common bile duct remains normal in caliber. Pancreas: The pancreas is within normal limits. Spleen: The spleen is unremarkable in appearance. Adrenals/Urinary Tract: The adrenal glands are unremarkable in appearance. Nonspecific perinephric stranding is noted bilaterally. Mild bilateral renal atrophy is noted. There is no evidence of hydronephrosis. No renal or ureteral stones are seen. Stranding about the left renal pelvis could reflect mild left-sided pyelonephritis. Stomach/Bowel: The stomach is unremarkable in appearance.  The small bowel is within normal limits. The appendix is not visualized; there is no evidence for appendicitis. Minimal diverticulosis is noted along the proximal sigmoid colon, without evidence of diverticulitis. Vascular/Lymphatic: Scattered calcification is seen along the abdominal aorta and its branches. The abdominal aorta is otherwise grossly unremarkable. The inferior vena cava is grossly unremarkable. Visualized retroperitoneal nodes remain borderline normal in size. No pelvic sidewall lymphadenopathy is identified. Reproductive: The prostate is enlarged, measuring 5.4 cm in transverse dimension, with impression on the base of the bladder. The bladder is mildly distended and grossly unremarkable. Other: No additional soft tissue abnormalities are seen. Musculoskeletal: No acute osseous abnormalities are identified. The visualized musculature is unremarkable in  appearance. IMPRESSION: 1. Stranding about the left renal pelvis could reflect mild left-sided pyelonephritis. Would correlate with the patient's symptoms. 2. Trace bilateral pleural fluid, with patchy bibasilar airspace opacities likely reflecting atelectasis. Evaluation of the lung fields is suboptimal due to motion artifact. 3. Scattered coronary artery calcifications seen. Calcification noted at the aortic valve. 4. Mild bilateral renal atrophy noted. 5. Minimal diverticulosis along the proximal sigmoid colon, without evidence of diverticulitis. 6. Scattered aortic atherosclerosis noted. 7. Enlarged prostate noted, with impression on the base of the bladder. Would correlate with PSA. Electronically Signed   By: Roanna RaiderJeffery  Chang M.D.   On: 12/24/2015 20:50   Ct Chest Wo Contrast  Result Date: 12/24/2015 CLINICAL DATA:  Acute onset of sepsis.  Initial encounter. EXAM: CT CHEST, ABDOMEN AND PELVIS WITHOUT CONTRAST TECHNIQUE: Multidetector CT imaging of the chest, abdomen and pelvis was performed following the standard protocol without IV contrast.  COMPARISON:  None. FINDINGS: CT CHEST FINDINGS Cardiovascular: Scattered coronary artery calcifications are seen. Calcification is noted at the aortic valve. Mild calcification is noted at the great vessels. The thoracic aorta is grossly unremarkable in appearance. The heart is normal in size. Mediastinum/Nodes: The mediastinum is otherwise grossly unremarkable. No mediastinal lymphadenopathy is seen. No pericardial effusion is identified. The visualized portions of the thyroid gland are unremarkable. No axillary lymphadenopathy is seen. Lungs/Pleura: Trace bilateral pleural fluid is noted, with patchy bibasilar opacities likely reflecting atelectasis. No pneumothorax is identified. No dominant mass is seen. Evaluation is suboptimal due to motion artifact. Musculoskeletal: No acute osseous abnormalities are identified. The chest wall is grossly unremarkable in appearance. CT ABDOMEN PELVIS FINDINGS Hepatobiliary: The liver is unremarkable in appearance. The gallbladder is unremarkable in appearance. The common bile duct remains normal in caliber. Pancreas: The pancreas is within normal limits. Spleen: The spleen is unremarkable in appearance. Adrenals/Urinary Tract: The adrenal glands are unremarkable in appearance. Nonspecific perinephric stranding is noted bilaterally. Mild bilateral renal atrophy is noted. There is no evidence of hydronephrosis. No renal or ureteral stones are seen. Stranding about the left renal pelvis could reflect mild left-sided pyelonephritis. Stomach/Bowel: The stomach is unremarkable in appearance. The small bowel is within normal limits. The appendix is not visualized; there is no evidence for appendicitis. Minimal diverticulosis is noted along the proximal sigmoid colon, without evidence of diverticulitis. Vascular/Lymphatic: Scattered calcification is seen along the abdominal aorta and its branches. The abdominal aorta is otherwise grossly unremarkable. The inferior vena cava is grossly  unremarkable. Visualized retroperitoneal nodes remain borderline normal in size. No pelvic sidewall lymphadenopathy is identified. Reproductive: The prostate is enlarged, measuring 5.4 cm in transverse dimension, with impression on the base of the bladder. The bladder is mildly distended and grossly unremarkable. Other: No additional soft tissue abnormalities are seen. Musculoskeletal: No acute osseous abnormalities are identified. The visualized musculature is unremarkable in appearance. IMPRESSION: 1. Stranding about the left renal pelvis could reflect mild left-sided pyelonephritis. Would correlate with the patient's symptoms. 2. Trace bilateral pleural fluid, with patchy bibasilar airspace opacities likely reflecting atelectasis. Evaluation of the lung fields is suboptimal due to motion artifact. 3. Scattered coronary artery calcifications seen. Calcification noted at the aortic valve. 4. Mild bilateral renal atrophy noted. 5. Minimal diverticulosis along the proximal sigmoid colon, without evidence of diverticulitis. 6. Scattered aortic atherosclerosis noted. 7. Enlarged prostate noted, with impression on the base of the bladder. Would correlate with PSA. Electronically Signed   By: Roanna RaiderJeffery  Chang M.D.   On: 12/24/2015 20:50   Dg Chest  Port 1 View  Result Date: 12/24/2015 CLINICAL DATA:  Sepsis and hypoxia EXAM: PORTABLE CHEST 1 VIEW COMPARISON:  03/12/2013 FINDINGS: Mild cardiac enlargement. Low lung volumes. Pulmonary vascular congestion noted. No pleural effusions or edema. No airspace consolidation. IMPRESSION: 1. Mild cardiac enlargement and pulmonary vascular congestion. Electronically Signed   By: Signa Kell M.D.   On: 12/24/2015 18:13        Scheduled Meds: . donepezil  10 mg Oral Daily  . dorzolamide  1 drop Both Eyes Daily  . finasteride  5 mg Oral Daily  . metoprolol succinate  25 mg Oral Daily  . multivitamin with minerals  1 tablet Oral Daily  . piperacillin-tazobactam (ZOSYN)   IV  3.375 g Intravenous Q8H  . potassium chloride  10 mEq Oral Daily  . simvastatin  20 mg Oral QPM  . timolol  1 drop Both Eyes BID  . vancomycin  1,500 mg Intravenous Q24H   Continuous Infusions: . sodium chloride 75 mL/hr at 12/25/15 0416     LOS: 1 day    Time spent: 35 minutes.     Alba Cory, MD [facility] Pager [phone]  If 7PM-7AM, please contact night-coverage [email or web address] Password [email or web address] 12/25/2015, 7:30 AM

## 2015-12-25 NOTE — Progress Notes (Signed)
ANTICOAGULATION CONSULT NOTE - Initial Consult  Pharmacy Consult for heparin Indication: chest pain/ACS  No Known Allergies  Patient Measurements: Height: 5' 11.65" (182 cm) Weight: 276 lb (125.2 kg) IBW/kg (Calculated) : 76.8 Heparin Dosing Weight: 103.4  Vital Signs: Temp: 99.2 F (37.3 C) (10/10 1140) Temp Source: Axillary (10/10 1140) BP: 131/73 (10/10 1319) Pulse Rate: 109 (10/10 1319)  Labs:  Recent Labs  12/24/15 1513  12/25/15 0141 12/25/15 0514 12/25/15 1034 12/25/15 1419  HGB 15.1  --   --   --  11.7*  --   HCT 47.3  --   --   --  36.0*  --   PLT 147*  --   --   --  106*  --   APTT  --   --  29  --   --   --   LABPROT  --   --  17.7*  --   --   --   INR  --   --  1.44  --   --   --   CREATININE 1.23  --   --   --  1.15  --   TROPONINI  --   < > 0.59* 1.24* 3.91* 5.15*  < > = values in this interval not displayed.  Estimated Creatinine Clearance: 70.9 mL/min (by C-G formula based on SCr of 1.15 mg/dL).   Medical History: Past Medical History:  Diagnosis Date  . Hyperlipidemia   . Hypertension     Medications:  See EMR  Assessment: 81 YOM to start heparin per pharmacy for r/o ACS with rising troponins. No PTA anti-coag. INR elevated on admission at 1.44. APTT 29 sec. CBC down from admission - Hgb 15.1>11.7, plt 147>106. No overt bleeding reported.   Goal of Therapy:  Heparin level 0.3-0.7 units/ml Monitor platelets by anticoagulation protocol: Yes   Plan:  -Heparin 4000 unit bolus followed 1300 units/hr -8hr HL -Monitor CBC, s/sx bleeding   Sherle Poeob Yamna Mackel, PharmD Clinical Pharmacist 4:48 PM, 12/25/2015

## 2015-12-25 NOTE — ED Notes (Signed)
Spoke with Dr. Katrinka BlazingSmith regarding pt lactic and troponin results.

## 2015-12-25 NOTE — Progress Notes (Signed)
Pt has been increasingly agitated and pulling at lines.  He has had 0.5mg  IV ativan and Geodon 5 mg IM since 0319. Pt agitation has increased with use of posey mitts. Was advised to administer Haldol 5mg  slow IV push.  Haldol administered and QTc measurement initiated on cardiac monitor. Will continue to monitor.

## 2015-12-25 NOTE — Plan of Care (Signed)
Problem: Fluid Volume: Goal: Hemodynamic stability will improve Outcome: Progressing Patient received 3L normal saline in ED, 4th not given to avoid respiratory complications

## 2015-12-25 NOTE — Progress Notes (Signed)
CRITICAL VALUE ALERT  Critical value received:  Lactic Acid 4.7  Date of notification:  12/25/15  Time of notification:  0850  Critical value read back:Yes.    Nurse who received alert:  Ronna PolioJennifer Erisa Mehlman  MD notified (1st page):  [place]  Time of first page:  [phone]  MD notified (2nd page):  Time of second page:  Responding MD:  Sunnie Nielsenegalado  Time MD responded:  [phone]

## 2015-12-25 NOTE — Progress Notes (Signed)
CRITICAL VALUE ALERT  Critical value received: Lactic Acid 2.3  Date of notification:  101017  Time of notification:  1746  Critical value read back:Yes.    Nurse who received alert:  Ronna PolioJennifer Destane Speas  MD notified (1st page):  [place]  Time of first page:  1746  MD notified (2nd page):  Time of second page:  Responding MD:  Sunnie Nielsenegalado  Time MD responded:  [phone]

## 2015-12-25 NOTE — ED Notes (Signed)
Attempted report 

## 2015-12-25 NOTE — Progress Notes (Signed)
eLink Physician-Brief Progress Note Patient Name: Daryl GaultDominic M Shimel DOB: 01/28/1934 MRN: [id number]   Date of Service  12/25/2015  HPI/Events of Note  Called re: rising troponin. Now hemodynamically improved. Cards has been consulted to see him. Will follow  eICU Interventions       Intervention Category Intermediate Interventions: Diagnostic test evaluation  Janina Trafton S. 12/25/2015, 3:52 PM

## 2015-12-26 ENCOUNTER — Encounter (HOSPITAL_COMMUNITY): Payer: Self-pay | Admitting: Cardiology

## 2015-12-26 ENCOUNTER — Ambulatory Visit (HOSPITAL_COMMUNITY): Payer: PPO

## 2015-12-26 DIAGNOSIS — R778 Other specified abnormalities of plasma proteins: Secondary | ICD-10-CM

## 2015-12-26 DIAGNOSIS — J9601 Acute respiratory failure with hypoxia: Secondary | ICD-10-CM

## 2015-12-26 DIAGNOSIS — R748 Abnormal levels of other serum enzymes: Secondary | ICD-10-CM

## 2015-12-26 DIAGNOSIS — N10 Acute tubulo-interstitial nephritis: Secondary | ICD-10-CM

## 2015-12-26 DIAGNOSIS — I5031 Acute diastolic (congestive) heart failure: Secondary | ICD-10-CM

## 2015-12-26 DIAGNOSIS — R7989 Other specified abnormal findings of blood chemistry: Secondary | ICD-10-CM

## 2015-12-26 LAB — CBC
HCT: 36 % — ABNORMAL LOW (ref 39.0–52.0)
HCT: 36.7 % — ABNORMAL LOW (ref 39.0–52.0)
Hemoglobin: 11.7 g/dL — ABNORMAL LOW (ref 13.0–17.0)
Hemoglobin: 12.2 g/dL — ABNORMAL LOW (ref 13.0–17.0)
MCH: 29.7 pg (ref 26.0–34.0)
MCH: 29.9 pg (ref 26.0–34.0)
MCHC: 32.5 g/dL (ref 30.0–36.0)
MCHC: 33.2 g/dL (ref 30.0–36.0)
MCV: 91.4 fL (ref 78.0–100.0)
MCV: 90 fL (ref 78.0–100.0)
PLATELETS: 106 10*3/uL — AB (ref 150–400)
Platelets: 129 10*3/uL — ABNORMAL LOW (ref 150–400)
RBC: 3.94 MIL/uL — ABNORMAL LOW (ref 4.22–5.81)
RBC: 4.08 MIL/uL — ABNORMAL LOW (ref 4.22–5.81)
RDW: 13.9 % (ref 11.5–15.5)
RDW: 14 % (ref 11.5–15.5)
WBC: 14.9 10*3/uL — ABNORMAL HIGH (ref 4.0–10.5)
WBC: 14.6 10*3/uL — ABNORMAL HIGH (ref 4.0–10.5)

## 2015-12-26 LAB — BASIC METABOLIC PANEL
Anion gap: 11 (ref 5–15)
BUN: 18 mg/dL (ref 6–20)
CALCIUM: 8.1 mg/dL — AB (ref 8.9–10.3)
CO2: 21 mmol/L — ABNORMAL LOW (ref 22–32)
Chloride: 107 mmol/L (ref 101–111)
Creatinine, Ser: 0.98 mg/dL (ref 0.61–1.24)
GFR calc Af Amer: >60 mL/min (ref >60)
GLUCOSE: 138 mg/dL — AB (ref 65–99)
POTASSIUM: 3.8 mmol/L (ref 3.5–5.1)
Sodium: 139 mmol/L (ref 135–145)

## 2015-12-26 LAB — URINE CULTURE: Culture: 20000 — AB

## 2015-12-26 LAB — HEPARIN LEVEL (UNFRACTIONATED)
Heparin Unfractionated: <0.1 IU/mL — ABNORMAL LOW (ref 0.30–0.70)
Heparin Unfractionated: 0.51 IU/mL (ref 0.30–0.70)

## 2015-12-26 LAB — TROPONIN I: Troponin I: 4.18 ng/mL (ref <0.03)

## 2015-12-26 LAB — ECHOCARDIOGRAM COMPLETE
Height: 71.654 in
Weight: 4416 oz

## 2015-12-26 MED ORDER — POLYETHYLENE GLYCOL 3350 17 G PO PACK
17.0000 g | PACK | Freq: Every day | ORAL | Status: DC
  Administered 2015-12-26 – 2015-12-29 (×4): 17 g via ORAL
  Filled 2015-12-26 (×4): qty 1

## 2015-12-26 MED ORDER — METOPROLOL SUCCINATE ER 25 MG PO TB24
25.0000 mg | ORAL_TABLET | Freq: Every day | ORAL | Status: DC
  Administered 2015-12-26 – 2015-12-27 (×2): 25 mg via ORAL
  Filled 2015-12-26 (×2): qty 1

## 2015-12-26 MED ORDER — FUROSEMIDE 40 MG PO TABS
40.0000 mg | ORAL_TABLET | Freq: Every day | ORAL | Status: DC
  Administered 2015-12-26: 40 mg via ORAL
  Filled 2015-12-26 (×2): qty 1

## 2015-12-26 MED ORDER — HEPARIN BOLUS VIA INFUSION
3000.0000 [IU] | Freq: Once | INTRAVENOUS | Status: AC
  Administered 2015-12-26: 3000 [IU] via INTRAVENOUS
  Filled 2015-12-26: qty 3000

## 2015-12-26 MED ORDER — ENSURE ENLIVE PO LIQD
237.0000 mL | Freq: Two times a day (BID) | ORAL | Status: DC
  Administered 2015-12-26 – 2015-12-29 (×3): 237 mL via ORAL

## 2015-12-26 NOTE — Progress Notes (Signed)
PULMONARY / CRITICAL CARE MEDICINE   Name: Daryl Salas MRN: [id number] DOB: 1933-06-02    ADMISSION DATE:  12/24/2015 CONSULTATION DATE:  12/25/15  REFERRING MD:  Lonia Farber MD  CHIEF COMPLAINT:  Septic shock.  SUBJECTIVE:  Now stable No issues over night VITAL SIGNS: BP (!) 148/71 (BP Location: Right Wrist)   Pulse (!) 101   Temp 98.9 F (37.2 C) (Axillary)   Resp (!) 22   Ht 5' 11.65" (1.82 m)   Wt 276 lb (125.2 kg)   SpO2 96%   BMI 37.79 kg/m  4 liters HEMODYNAMICS:    VENTILATOR SETTINGS:    INTAKE / OUTPUT:  Intake/Output Summary (Last 24 hours) at 12/26/15 0947 Last data filed at 12/26/15 0428  Gross per 24 hour  Intake          1365.13 ml  Output             1675 ml  Net          -309.87 ml     PHYSICAL EXAMINATION: General:  No distress, resting in bed. Awakens easily.  Neuro:  Awake, baseline confusion (verified w/ daughter), moves all extremities HEENT:  Dry mucus membranes, No thyromegaly, JVD Cardiovascular:  RRR, no MRG Lungs:  Clear, decreased bases  Abdomen:  Soft, + BS Musculoskeletal:  Normal tone and bulk Skin:  Intact  LABS:  BMET  Recent Labs Lab 12/24/15 1513 12/25/15 1034 12/26/15 0130  NA 141 142 139  K 3.9 3.6 3.8  CL 105 110 107  CO2 19* 23 21*  BUN 16 19 18   CREATININE 1.23 1.15 0.98  GLUCOSE 129* 156* 138*    Electrolytes  Recent Labs Lab 12/24/15 1513 12/25/15 1034 12/26/15 0130  CALCIUM 8.8* 7.8* 8.1*    CBC  Recent Labs Lab 12/24/15 1513 12/25/15 1034 12/26/15 0130  WBC 2.2* 14.9* 14.6*  HGB 15.1 11.7* 12.2*  HCT 47.3 36.0* 36.7*  PLT 147* 106* 129*    Coag's  Recent Labs Lab 12/25/15 0141  APTT 29  INR 1.44    Sepsis Markers  Recent Labs Lab 12/25/15 1034 12/25/15 1419 12/25/15 1641  LATICACIDVEN 3.3* 2.3* 2.3*    ABG No results for input(s): PHART, PCO2ART, PO2ART in the last 168 hours.  Liver Enzymes  Recent Labs Lab 12/24/15 1513 12/25/15 1034  AST 49* 94*   ALT 26 61  ALKPHOS 56 37*  BILITOT 1.9* 1.4*  ALBUMIN 4.4 3.2*    Cardiac Enzymes  Recent Labs Lab 12/25/15 1419 12/25/15 2149 12/26/15 0130  TROPONINI 5.15* 4.04* 4.18*    Glucose  Recent Labs Lab 12/24/15 1440  GLUCAP 115*    Imaging No results found.   STUDIES:  CT chest, abd, pelvis 10/9 > stranding around the left kidney ? left pyelonephritis. Trace bilateral effusions with atelectasis. No consolidation in the lungs. Images personally reviewed.   ASSESSMENT / PLAN:  Severe Sepsis w/ resolved septic shock in setting of UTI (morganella morganii), left pyelonephritis and resultant GNR bacteremia  ->shock resolved.  ->hemodynamically stable  Plan:   Cont abx Would narrow zosyn as soon as BC resulted.   H/O a fib Diastolic CHF Elevated troponins > ? Demand-->cards following Plan:  Cont heparin (defer to cards); w/ plan to switch to asa and plavix after 48hrs Plan is to treat conservatively d/t dementia and functional status per cards note  Pulmonary edema d/t volume resuscitation efforts.  Plan F/u CXR PRN Wean O2 Cont lasix as BUN/cr and BP allow  Dementia Delirium Plan:   Continue aricept Haldol PRN.  discussion:  Hemodynamically stable Cultures growing M Morganii in urine & GNR in blood. He is auto-diuresing and Cards has seen re: his demand ischemia. At this point we will s/o. Please call if we can be of further assist.   Simonne MartinetPeter E Zorana Brockwell ACNP-BC [facility] Pulmonary/Critical Care Pager # [phone] OR # [phone] if no answer  12/26/2015, 9:43 AM

## 2015-12-26 NOTE — Plan of Care (Signed)
Problem: Safety: Goal: Ability to remain free from injury will improve Outcome: Progressing Safety sister placed while patient's daughter unable to stay with him.   Problem: Skin Integrity: Goal: Risk for impaired skin integrity will decrease Outcome: Progressing Patient bathed and protective cream/lotion applied to skin to prevent breakdown or MASD.  Problem: Activity: Goal: Risk for activity intolerance will decrease Outcome: Progressing Patient worked with PT today and was able to stand up and sit back down.   Problem: Fluid Volume: Goal: Ability to maintain a balanced intake and output will improve Outcome: Progressing Patient diuresed with good urine output. Greater than 3500 for this shift.  Problem: Nutrition: Goal: Adequate nutrition will be maintained Outcome: Progressing Patient encouraged to eat meals. Able to get patient to drink an Ensure to supplement nutrition.

## 2015-12-26 NOTE — [place] FL2 (Signed)
[place] MEDICAID FL2 LEVEL OF CARE SCREENING TOOL     IDENTIFICATION  Patient Name: Daryl Salas Birthdate: 12/09/1933 Sex: male Admission Date (Current Location): 12/24/2015  [facility] [place]   Facility and Address:  [facility], [address], [place], [place] [address]      Provider Number: [id number]  Attending Physician Name and Address:  Alba CoryBelkys A Regalado, MD  Relative Name and Phone Number:  Daryl Salas, daughter, [phone]    Current Level of Care: Hospital Recommended Level of Care: Skilled Nursing Facility Prior Approval Number:    Date Approved/Denied:   PASRR Number: [id number] [id number]  Discharge Plan: SNF    Current Diagnoses: Patient Active Problem List   Diagnosis Date Noted  . Acute pyelonephritis   . Elevated troponin I level   . Acute respiratory failure with hypoxia (HCC)   . Acute encephalopathy 12/25/2015  . BPH (benign prostatic hyperplasia) 12/25/2015  . Hematuria 12/25/2015  . Atrial fibrillation (HCC) 12/25/2015  . Sepsis (HCC) 12/24/2015  . Abscess of left thigh 08/11/2012    Orientation RESPIRATION BLADDER Height & Weight     Self  O2 (Nasal cannula 2L) Incontinent, Indwelling catheter (Urinary catheter) Weight: 125.2 kg (276 lb) Height:  5' 11.65" (182 cm)  BEHAVIORAL SYMPTOMS/MOOD NEUROLOGICAL BOWEL NUTRITION STATUS      Continent Diet (Please see DC summary)  AMBULATORY STATUS COMMUNICATION OF NEEDS Skin   Extensive Assist Verbally Normal                       Personal Care Assistance Level of Assistance  Bathing, Feeding, Dressing Bathing Assistance: Maximum assistance Feeding assistance: Limited assistance Dressing Assistance: Maximum assistance     Functional Limitations Info  Speech     Speech Info: Impaired    SPECIAL CARE FACTORS FREQUENCY  PT (By licensed PT)     PT Frequency: 5x/week              Contractures      Additional Factors Info  Code  Status, Allergies Code Status Info: Full Allergies Info: NKA           Current Medications (12/26/2015):  This is the current hospital active medication list Current Facility-Administered Medications  Medication Dose Route Frequency Provider Last Rate Last Dose  . acetaminophen (TYLENOL) tablet 650 mg  650 mg Oral Q6H PRN Clydie Braunondell A Smith, MD       Or  . acetaminophen (TYLENOL) suppository 650 mg  650 mg Rectal Q6H PRN Clydie Braunondell A Smith, MD      . donepezil (ARICEPT) tablet 10 mg  10 mg Oral Daily Clydie Braunondell A Smith, MD   10 mg at 12/26/15 1212  . dorzolamide (TRUSOPT) 2 % ophthalmic solution 1 drop  1 drop Both Eyes Daily Clydie Braunondell A Smith, MD   1 drop at 12/26/15 1220  . feeding supplement (ENSURE ENLIVE) (ENSURE ENLIVE) liquid 237 mL  237 mL Oral BID BM Belkys A Regalado, MD   237 mL at 12/26/15 1225  . finasteride (PROSCAR) tablet 5 mg  5 mg Oral Daily Rondell A Katrinka BlazingSmith, MD   5 mg at 12/26/15 1211  . furosemide (LASIX) tablet 40 mg  40 mg Oral Daily Belkys A Regalado, MD   40 mg at 12/26/15 1211  . haloperidol lactate (HALDOL) injection 1 mg  1 mg Intravenous Q6H PRN Belkys A Regalado, MD   1 mg at 12/26/15 0203  . heparin ADULT infusion 100 units/mL (  25000 units/240mL sodium chloride 0.45%)  2,000 Units/hr Intravenous Continuous Bertram Millard, RPH 20 mL/hr at 12/26/15 1435 2,000 Units/hr at 12/26/15 1435  . ipratropium-albuterol (DUONEB) 0.5-2.5 (3) MG/3ML nebulizer solution 3 mL  3 mL Nebulization Q4H PRN Clydie Braun, MD   3 mL at 12/26/15 0512  . metoprolol succinate (TOPROL-XL) 24 hr tablet 25 mg  25 mg Oral Daily Belkys A Regalado, MD   25 mg at 12/26/15 1212  . multivitamin with minerals tablet 1 tablet  1 tablet Oral Daily Clydie Braun, MD   1 tablet at 12/26/15 1211  . ondansetron (ZOFRAN) tablet 4 mg  4 mg Oral Q6H PRN Clydie Braun, MD       Or  . ondansetron (ZOFRAN) injection 4 mg  4 mg Intravenous Q6H PRN Clydie Braun, MD      . piperacillin-tazobactam (ZOSYN) IVPB  3.375 g  3.375 g Intravenous Q8H Justin R Crowder, Student-PharmD 12.5 mL/hr at 12/26/15 0856 3.375 g at 12/26/15 0856  . polyethylene glycol (MIRALAX / GLYCOLAX) packet 17 g  17 g Oral Daily Belkys A Regalado, MD   17 g at 12/26/15 1213  . potassium chloride (K-DUR) CR tablet 10 mEq  10 mEq Oral Daily Clydie Braun, MD   10 mEq at 12/26/15 1211  . simvastatin (ZOCOR) tablet 20 mg  20 mg Oral QPM Clydie Braun, MD   20 mg at 12/25/15 2023  . timolol (TIMOPTIC) 0.5 % ophthalmic solution 1 drop  1 drop Both Eyes BID Clydie Braun, MD   1 drop at 12/26/15 1220     Discharge Medications: Please see discharge summary for a list of discharge medications.  Relevant Imaging Results:  Relevant Lab Results:   Additional Information SSN: [id number] [place], [place]

## 2015-12-26 NOTE — Progress Notes (Signed)
ANTICOAGULATION CONSULT NOTE - F/U Consult  Pharmacy Consult for heparin Indication: chest pain/ACS  No Known Allergies  Patient Measurements: Height: 5' 11.65" (182 cm) Weight: 276 lb (125.2 kg) IBW/kg (Calculated) : 76.8 Heparin Dosing Weight: 103.4  Vital Signs: Temp: 100.3 F (37.9 C) (10/11 0956) Temp Source: Axillary (10/11 0956) BP: 143/85 (10/11 1212) Pulse Rate: 100 (10/11 1212)  Labs:  Recent Labs  12/24/15 1513 12/25/15 0141  12/25/15 1034 12/25/15 1419 12/25/15 2149 12/26/15 0130 12/26/15 0303 12/26/15 1101  HGB 15.1  --   --  11.7*  --   --  12.2*  --   --   HCT 47.3  --   --  36.0*  --   --  36.7*  --   --   PLT 147*  --   --  106*  --   --  129*  --   --   APTT  --  29  --   --   --   --   --   --   --   LABPROT  --  17.7*  --   --   --   --   --   --   --   INR  --  1.44  --   --   --   --   --   --   --   HEPARINUNFRC  --   --   --   --   --   --   --  <0.10* <0.10*  CREATININE 1.23  --   --  1.15  --   --  0.98  --   --   TROPONINI  --  0.59*  < > 3.91* 5.15* 4.04* 4.18*  --   --   < > = values in this interval not displayed.  Estimated Creatinine Clearance: 83.2 mL/min (by C-G formula based on SCr of 0.98 mg/dL).   Assessment: CC: AMS, diaphoretic, incontinent, hematuria  PMH: HTN, CHF, HLD, BPH  Patient had trop elevation in setting of sepsis - IV hep x 48 hrs per Cards  Anticoag: none pta - Hep Lvl < 0.1 on 1700 units/hr  Nephro: SCr 0.98  Heme/Onc: H&H 12.2/36.7, Plt 129  Goal of Therapy:  Heparin level 0.3-0.7 units/ml Monitor platelets by anticoagulation protocol: Yes   Plan:  Heparin bolus 3000 units x 1  Increase drip to 2000 units/hr Next lvl at 2100 Daily hep lvl, cbc  Isaac BlissMichael Omarian Jaquith, PharmD, BCPS, [facility] Clinical Pharmacist Pager [phone] 12/26/2015 12:43 PM

## 2015-12-26 NOTE — Progress Notes (Signed)
ANTICOAGULATION CONSULT NOTE - Follow Up Consult  Pharmacy Consult for heparin Indication: chest pain/ACS  Labs:  Recent Labs  12/24/15 1513 12/25/15 0141  12/25/15 1034 12/25/15 1419 12/25/15 2149 12/26/15 0130 12/26/15 0303  HGB 15.1  --   --  11.7*  --   --  12.2*  --   HCT 47.3  --   --  36.0*  --   --  36.7*  --   PLT 147*  --   --  106*  --   --  129*  --   APTT  --  29  --   --   --   --   --   --   LABPROT  --  17.7*  --   --   --   --   --   --   INR  --  1.44  --   --   --   --   --   --   HEPARINUNFRC  --   --   --   --   --   --   --  <0.10*  CREATININE 1.23  --   --  1.15  --   --  0.98  --   TROPONINI  --  0.59*  < > 3.91* 5.15* 4.04* 4.18*  --   < > = values in this interval not displayed.   Assessment: 82yo male undetectable on heparin with initial dosing for rising troponins.  Goal of Therapy:  Heparin level 0.3-0.7 units/ml   Plan:  Will rebolus with heparin 3000 units and increase gtt by 4 units/kgABW/hr to 1700 units/hr and check level in 6hr.  Vernard GamblesVeronda Torryn Hudspeth, PharmD, BCPS  12/26/2015,4:38 AM

## 2015-12-26 NOTE — Progress Notes (Signed)
*  PRELIMINARY RESULTS* Echocardiogram 2D Echocardiogram has been performed.  Daryl Salas, Daryl Salas 12/26/2015, 2:42 PM

## 2015-12-26 NOTE — Clinical Social Work Note (Signed)
Clinical Social Work Assessment  Patient Details  Name: Daryl GaultDominic M Morrow MRN: [id number] Date of Birth: 07/30/1933  Date of referral:  12/26/15               Reason for consult:  Facility Placement                Permission sought to share information with:  Facility Medical sales representativeContact Representative, Family Supports Permission granted to share information::  Yes, Verbal Permission Granted  Name::     Laurence FerrariRita, Susanne  Agency::  SNFs  Relationship::  wife, dtr  Contact Information:     Housing/Transportation Living arrangements for the past 2 months:  Single Family Home Source of Information:  Spouse Patient Interpreter Needed:  None Criminal Activity/Legal Involvement Pertinent to Current Situation/Hospitalization:  No - Comment as needed Significant Relationships:  Adult Children, Spouse Lives with:  Spouse Do you feel safe going back to the place where you live?  No Need for family participation in patient care:  Yes (Comment) (decision making)  Care giving concerns:  Pt lives at home with wife- dtr comes by daily to assist with exercises.     Social Worker assessment / plan:  CSW spoke with wife about recommendation for pt to go to SNF for short term rehab.  Wife acknowledges that he is weaker than normal- states at home he can get around independently with assistive devices (walker).  CSW explained SNF and referral process.  Employment status:  Retired Health and safety inspectornsurance information:    PT Recommendations:  Skilled Teacher, early years/preursing Facility Information / Referral to community resources:  Skilled Nursing Facility  Patient/Family's Response to care:  Wife believes that pt will need SNF but does not want to make decisions until she consults with her dtr.  Patient/Family's Understanding of and Emotional Response to Diagnosis, Current Treatment, and Prognosis:  No questions or concerns at this time- hopeful pt will improve fully with rehab stay.  Emotional Assessment Appearance:  Appears stated  age Attitude/Demeanor/Rapport:  Unable to Assess Affect (typically observed):  Unable to Assess Orientation:  Oriented to Self Alcohol / Substance use:  Not Applicable Psych involvement (Current and /or in the community):  No (Comment)  Discharge Needs  Concerns to be addressed:  Care Coordination Readmission within the last 30 days:  No Current discharge risk:  Physical Impairment Barriers to Discharge:  Continued Medical Work up   Burna SisUris, Aunika Kirsten H, LCSW 12/26/2015, 4:52 PM

## 2015-12-26 NOTE — Evaluation (Signed)
Physical Therapy Evaluation Patient Details Name: Elliot GaultDominic M Lich MRN: [id number] DOB: 03/22/1933 Today's Date: 12/26/2015   History of Present Illness  [name] is a 82 year old with past medical history of hypertension, hyperlipidemia, dementia. He was admitted yesterday with bleeding from the tip of the penis. He was noted to be hypotensive in urgent care and [facility] . When he was found to have leukopenia, elevated lactic acid, hypertension, borderline urinalysis. He had a CT of the abdomen pelvis which showed possible pyelonephritis.   Clinical Impression  Pt admitted with above diagnosis. Pt currently with functional limitations due to the deficits listed below (see PT Problem List). Pt very limited with functional mobility, required +2 max A to roll in bed, transfer to sitting, transfer to standing, and was unable to ambulate. Pt with eyes closed much of session and not following commands consistently. Do not feel in current state that he would be safe to go home with family.  Pt will benefit from skilled PT to increase their independence and safety with mobility to allow discharge to the venue listed below.       Follow Up Recommendations SNF    Equipment Recommendations  None recommended by PT    Recommendations for Other Services OT consult     Precautions / Restrictions Precautions Precautions: Fall Restrictions Weight Bearing Restrictions: No      Mobility  Bed Mobility Overal bed mobility: Needs Assistance;+2 for physical assistance Bed Mobility: Rolling;Supine to Sit;Sit to Supine Rolling: +2 for physical assistance;Total assist   Supine to sit: Max assist;+2 for physical assistance Sit to supine: Max assist;+2 for physical assistance   General bed mobility comments: pt required tot A +2 to roll because he was fearful of falling off bed and was pushing bkwd. Tried to make him understand that he needed to roll to get on bed pan to have BM but  was still resistant to rolling. Hand over hand guidance to rail for rolling prior to sitting up, Max A at LE's and UE's/ trunk. Max A +2 to scoot to EOB. Pt having trouble keeping eyes open and minimally conversant. Sat with neck flexed, difficulty looking up or sitting upright  Transfers Overall transfer level: Needs assistance Equipment used: 2 person hand held assist Transfers: Sit to/from Stand Sit to Stand: Max assist;+2 physical assistance         General transfer comment: Max A +2 for power up, fwd wt shift, and blocking feet. Pt unable to shift fwd enough to get wt off bed in standing. Stood 2x. Attempted stepping but pt unable.   Ambulation/Gait             General Gait Details: unable  Stairs            Wheelchair Mobility    Modified Rankin (Stroke Patients Only)       Balance Overall balance assessment: Needs assistance Sitting-balance support: Feet supported Sitting balance-Leahy Scale: Poor Sitting balance - Comments: required mod A to maintain sitting EOB Postural control: Posterior lean Standing balance support: Bilateral upper extremity supported Standing balance-Leahy Scale: Zero                               Pertinent Vitals/Pain Pain Assessment: Faces Faces Pain Scale: Hurts even more Pain Location: LE's with movement Pain Descriptors / Indicators: Aching Pain Intervention(s): Limited activity within patient's tolerance;Monitored during session  HR 115 bpm in sitting. O2 sats 94% on 2L O2.  Home Living Family/patient expects to be discharged to:: Private residence Living Arrangements: Spouse/significant other Available Help at Discharge: Family;Available 24 hours/day Type of Home: House Home Access: Stairs to enter   [employer] of Steps: 3 Home Layout: Two level;Bed/bath upstairs Home Equipment: Walker - 2 wheels;Shower seat Additional Comments: stair info from pt who is not a reliable historian, need  to confirm this. Daughter present for PLOF so trust the accuracy of it more    Prior Function Level of Independence: Needs assistance   Gait / Transfers Assistance Needed: ambulated with RW independently within home  ADL's / Homemaking Assistance Needed: wife helped with bathing and dressing using shower chair        Hand Dominance   Dominant Hand: Right    Extremity/Trunk Assessment   Upper Extremity Assessment: Generalized weakness;Defer to OT evaluation           Lower Extremity Assessment: Generalized weakness;RLE deficits/detail;LLE deficits/detail;Difficult to assess due to impaired cognition RLE Deficits / Details: RLE weaker than left (needed TKA and didn't get it after getting L TKA). hip flex 2/5, ankle 3/5, knee grossly 2/5 LLE Deficits / Details: slightly stronger than right, bilateral LE's swollen and tender to touch  Cervical / Trunk Assessment: Kyphotic  Communication   Communication: No difficulties  Cognition Arousal/Alertness: Lethargic;Suspect due to medications Behavior During Therapy: Anxious Overall Cognitive Status: Impaired/Different from baseline Area of Impairment: Orientation;Following commands;Problem solving Orientation Level: Disoriented to;Place;Time;Situation   Memory: Decreased short-term memory Following Commands: Follows one step commands inconsistently;Follows one step commands with increased time     Problem Solving: Slow processing;Decreased initiation;Difficulty sequencing;Requires verbal cues;Requires tactile cues General Comments: pt with dementia at baseline but per daughter is currently worse    General Comments General comments (skin integrity, edema, etc.): pt mildly confused throughout session. Daughter reports that he walked the day he came into hospital but functionally very different at this point.     Exercises     Assessment/Plan    PT Assessment Patient needs continued PT services  PT Problem List Decreased  strength;Decreased activity tolerance;Decreased balance;Decreased mobility;Decreased coordination;Decreased cognition;Decreased knowledge of use of DME;Decreased knowledge of precautions;Pain;Obesity          PT Treatment Interventions DME instruction;Gait training;Functional mobility training;Therapeutic exercise;Therapeutic activities;Balance training;Patient/family education;Cognitive remediation    PT Goals (Current goals can be found in the Care Plan section)  Acute Rehab PT Goals Patient Stated Goal: none stated PT Goal Formulation: With patient Time For Goal Achievement: 01/09/16 Potential to Achieve Goals: Fair    Frequency Min 2X/week   Barriers to discharge Inaccessible home environment stairs    Co-evaluation               End of Session   Activity Tolerance: Patient limited by lethargy;Patient limited by fatigue;Patient limited by pain Patient left: in bed;with call bell/phone within reach;with family/visitor present Nurse Communication: Mobility status         Time: 1440-1511 PT Time Calculation (min) (ACUTE ONLY): 31 min   Charges:   PT Evaluation $PT Eval Moderate Complexity: 1 Procedure PT Treatments $Therapeutic Activity: 8-22 mins   PT G Codes:      Lyanne Co, PT  Acute Rehab Services  [phone]   Lyanne Co 12/26/2015, 3:37 PM

## 2015-12-26 NOTE — Consult Note (Signed)
Cardiology Consult Note  Admit date: 12/24/2015 Name: Daryl Salas 82 y.o.  male DOB:  Jul 21, 1933 MRN:  [id number]  Today's date:  12/26/2015  Referring Physician:    [facility]  Primary Physician:    Dr. Catha Gosselin  Reason for Consultation:   Abnormal troponin in the setting of sepsis  IMPRESSIONS: 1.  Low level troponin elevation likely due to demand ischemia in the setting of sepsis and hypotension-I don't think that this was an acute ST elevation myocardial infarction 2.  History of chronic diastolic heart failure previous echo showed ejection fraction of 60% 3.  Hypertensive heart disease 4.  Gram-negative sepsis 5.  Significant underlying dementia 6. History of mild aortic valve calcification and regurgitation 7. History of CAD diagnosed by coronary calcifcation  RECOMMENDATION: 1.  Echo is reasonable and will await results of LV function.  Previous left ventricular function was preserved but had diastolic dysfunction treated with furosemide 2.  Continue heparin for 48 hours and then changed to Plavix and aspirin 3.  Continue treatment of sepsis and continue other home medications 4.  Because of his dementia and functional status treat conservatively and don't think any additional cardiovascular workup will be necessary.  He is not a candidate due to his significant dementia for other invasive cardiac studies.  HISTORY: This very nice 82 year old male has been seen by me for a couple of years.  He initially presented with edema and shortness of breath and was found to have diastolic heart failure.  He was treated with furosemide as an outpatient and blood pressure control and has really done fairly well.  His functional status is good at home and he has a very caring family that watches after him.  He does have some coronary calcification noted on a previous CT scan so we know that he has underlying coronary disease but in view of his preserved functional status have  not opted to evaluate that.  A previous echocardiogram 2 years ago showed calcification of the aortic valve with mild aortic regurgitation and preserved LV function with evidence of diastolic dysfunction.  According to the daughter his dementia has worsened recently and he is having much more problems with short-term memory.  He is admitted with gram-negative sepsis and developed some hypotension.  Troponin was drawn and is elevated and is coming back down now.  The patient is currently awake but does not know where he is.  He does not complain of any chest pain or shortness of breath.  His daughter notes that prior to this he was not having chest pain or shortness of breath.  His history is very difficult otherwise.  He was started on heparin last night because the abnormal troponin.  He is not currently having any urinary bleeding that was present on admission.  Past Medical History:  Diagnosis Date  . BPH (benign prostatic hyperplasia)   . Dementia   . Hyperlipidemia   . Hypertension   . Obesity (BMI 30-39.9)   . Type 2 diabetes mellitus (HCC)       Past Surgical History:  Procedure Laterality Date  . CARPAL TUNNEL RELEASE Right   . CATARACT EXTRACTION    . TONSILLECTOMY    . TOTAL KNEE ARTHROPLASTY Left 2005     Allergies:  has No Known Allergies.   Medications: Prior to Admission medications   Medication Sig Start Date End Date Taking? Authorizing Provider  aspirin 81 MG tablet Take 81 mg by mouth daily.   Yes Historical  Provider, MD  benazepril (LOTENSIN) 40 MG tablet Take 40 mg by mouth daily.   Yes Historical Provider, MD  cholecalciferol (VITAMIN D) 1000 UNITS tablet Take 2,000 Units by mouth daily.   Yes Historical Provider, MD  Coenzyme Q10 (CO Q 10) 100 MG CAPS Take by mouth daily.   Yes Historical Provider, MD  donepezil (ARICEPT) 10 MG tablet Take 10 mg by mouth daily. 10/15/15  Yes Historical Provider, MD  dorzolamide (TRUSOPT) 2 % ophthalmic solution Place 1 drop into  both eyes daily. 10/19/15  Yes Historical Provider, MD  finasteride (PROSCAR) 5 MG tablet Take 5 mg by mouth daily.   Yes Historical Provider, MD  fish oil-omega-3 fatty acids 1000 MG capsule Take 2 g by mouth daily.   Yes Historical Provider, MD  furosemide (LASIX) 20 MG tablet Take 60 mg by mouth daily. 10/15/15  Yes Historical Provider, MD  metoprolol succinate (TOPROL-XL) 25 MG 24 hr tablet Take 25 mg by mouth daily. 11/23/15  Yes Historical Provider, MD  Multiple Vitamins-Minerals (MULTIVITAMIN WITH MINERALS) tablet Take 1 tablet by mouth daily.   Yes Historical Provider, MD  potassium chloride (K-DUR) 10 MEQ tablet Take 10 mEq by mouth daily. 10/15/15  Yes Historical Provider, MD  simvastatin (ZOCOR) 20 MG tablet Take 20 mg by mouth every evening.   Yes Historical Provider, MD  timolol (BETIMOL) 0.5 % ophthalmic solution Place 1 drop into both eyes 2 (two) times daily.    Yes Historical Provider, MD  vitamin E 400 UNIT capsule Take 400 Units by mouth daily.   Yes Historical Provider, MD   Family History: Family Status  Relation Status  . Mother Deceased  . Father Deceased    Social History:   reports that he has never smoked. He has never used smokeless tobacco. He reports that he does not drink alcohol or use drugs.   He is currently married and lives at home with his wife.  His daughter assists with his care significantly.  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   Review of Systems: Not obtainable due to his dementia  Physical Exam: BP (!) 148/71 (BP Location: Right Wrist)   Pulse (!) 101   Temp 98.9 F (37.2 C) (Axillary)   Resp (!) 22   Ht 5' 11.65" (1.82 m)   Wt 125.2 kg (276 lb)   SpO2 96%   BMI 37.79 kg/m   General appearance: pleasand WM in NAD, not oriented lying in bed Head: Normocephalic, without obvious abnormality, atraumatic Eyes: negative Neck: no adenopathy, no carotid bruit, no JVD and supple, symmetrical, trachea midline Lungs: clear to auscultation  bilaterally Heart: regular rhythm, normal s1 and s2, 2/6 systolic murmur across aortic valve Abdomen: soft, non-tender; bowel sounds normal; no masses,  no organomegaly Rectal: deferred Extremities: 1+ edema, normal Rom,  mild wrinkled skin lower legs Pulses: 2+ and symmetric Skin: Skin color, texture, turgor normal. No rashes or lesions Neurologic:  Not oriented to time or date or place, moves all four extremities, no focal deficits or CN issues  Labs: CBC  Recent Labs  12/24/15 1513  12/26/15 0130  WBC 2.2*  < > 14.6*  RBC 5.03  < > 4.08*  HGB 15.1  < > 12.2*  HCT 47.3  < > 36.7*  PLT 147*  < > 129*  MCV 94.0  < > 90.0  MCH 30.0  < > 29.9  MCHC 31.9  < > 33.2  RDW 13.5  < > 14.0  LYMPHSABS 0.3*  --   --  MONOABS 0.0*  --   --   EOSABS 0.1  --   --   BASOSABS 0.0  --   --   < > = values in this interval not displayed. CMP   Recent Labs  12/25/15 1034 12/26/15 0130  NA 142 139  K 3.6 3.8  CL 110 107  CO2 23 21*  GLUCOSE 156* 138*  BUN 19 18  CREATININE 1.15 0.98  CALCIUM 7.8* 8.1*  PROT 5.8*  --   ALBUMIN 3.2*  --   AST 94*  --   ALT 61  --   ALKPHOS 37*  --   BILITOT 1.4*  --   GFRNONAA 59* >60  GFRAA >60 >60   BNP (last 3 results) BNP    Component Value Date/Time   BNP 474.0 (H) 12/25/2015 0141   Cardiac Panel (last 3 results) Troponin (Point of Care Test) No results for input(s): TROPIPOC in the last 72 hours. Cardiac Panel (last 3 results)  Recent Labs  12/25/15 1419 12/25/15 2149 12/26/15 0130  TROPONINI 5.15* 4.04* 4.18*     Radiology:  Mild cardiac enlargement  EKG: Sinus rhythm with occasional PVC's, minor nonspecific ST changes  Signed:  W. Ashley RoyaltySpencer Tilley, Jr. MD [facility] GraylingFACC   Cardiology Consultant  12/26/2015, 8:50 AM

## 2015-12-26 NOTE — Progress Notes (Signed)
PROGRESS NOTE    Daryl Salas  ZOX:[id number] DOB: 01/08/1934 DOA: 12/24/2015 PCP: Mickie Hillier, MD   Brief Narrative: Daryl Salas is a 82 y.o. male with medical history significant of HTN, CHF, HLD, BPH; who presents with complaints of bleeding from penis and altered mental status. History is obtained from the wife and daughter as the patient is acutely confused more than usual and unable to provide his own. Wife states that she saw the patient was bleeding from the tip his penis is a well urinating. Patient denied scratching his penis or having any other trauma to her knowledge. They took him to the urgent care while and while there the patient started yawning. It was unusual as the patient continued to do this and became pale and diaphoretic. The daughter had nursing care at urgent care facility check his blood pressure and states that his systolic BPs were in 70s. Over the last few days the family notes that the patient had had decreased by mouth intake and question if he was dehydrated. Family notes that the patient has been followed by Dr Viann Fish of cardiology and had previous echocardiograms in the past. They are not aware of his current heart function.  ED Course: Upon admission into the emergency department patient was seen to be hypothermic temperature of 96.57F then febrile up to 100.8, heart rates 109-125, respirations 14-31, blood pressure maintained 102/57/170 7/69, O2 saturations 18 on 3 L of nasal cannula oxygen. Lab work revealed WBC 2.2, hemoglobin 15.1, platelets 147, sodium 141, potassium 3.9, chloride 105, CO2 19, BUN 16, creatinine 1.22, lactic acid 8.77. Patient was given 3 L of IV fluids in the ED with vancomycin and Zosyn per sepsis protocol. However, patient required nasal cannula oxygen to maintain O2 saturations. In out catheter revealed initially for bloody then turn pinkish urine. TRH called to admit to stepdown.  Assessment & Plan:   Principal  Problem:   Sepsis (HCC) Active Problems:   Acute encephalopathy   BPH (benign prostatic hyperplasia)   Hematuria   Atrial fibrillation (HCC)   1-Sepsis, source could be UTI, CT finding consistent with Pyelonephritis and possible prostatitis. Gram Negative Bacteremia;  Presents with Lactic acid at 8--trending down to 2.3 He has received 3 L IV fluids.  Patient has history of diastolic HF.  holding lasix due to concern for sepsis.  Continue with IV Antibiotics, Zosyn. Follow sensitivity from blood culture, urine culture.  Follow blood culture. Growing gram negative rods.  Appreciate CCM assistance. BP remain stable.  Day 3 antibiotics.   2-Acute Respiratory Failure: At baseline patient not on oxygen at home patient required at least 3 L nasal cannula oxygen to maintain O2 sats following IV fluid hydration for sepsis protocol. - continue with oxygen supplementation.  -PRN Nebulize treatments.   3-N-STEMI, Elevation of troponin;  Denies chest pain. Troponin Peak to 5--trending down 4.  ECHO ordered.  Repeat EKG.  Discussed with Dr Donnie Aho , ok to continue with heparin, if hematuria gets worse ok to hold heparin.  On heparin Gtt. Hb stable.  Resume metoprolol, now that BP has increased.   4-Hematuria: Acute. Related to cystitis vs prostatitis.  - Place three-way Foley catheter with coude tip for BPH - Monitor bleeding from meatus -Appreciate Urology evaluation.  -irrigation PRN.   Possible atrial fibrillation: Acute. no previous history although cannot be completely sure given a lot of artifact on EKG.  - continue to monitor -TSH norma.  - repeat EKG. -resume metoprolol, SBP  increasing.   Essential hypertension: Hold benazepril and resume metropolol   Congestive heart failure with possible acute exacerbation :  Patient was initially given 3 L of IV fluids for sepsis. Patient requiring nasal cannula oxygen of 3 L to maintain O2 saturations with some tachypnea. Chest x-ray  showing cardiomegaly with signs of central congestion possibly after first liter of fluids. - Strict In and Outs Resume oral lasix.   Acute encephalopathy/Dementia with behavioral disturbance  - haldol help with agitation. Will order PRN haldol.  - Continued donepezil once able - Changed patient to NPO, speech therapy to eval and treat  BPH -  Continue Proscar once able  Transaminases; follow trend.    DVT prophylaxis: SCD Code Status: Full code.  Family Communication: Daughter at bedside.  Disposition Plan: remain inpatient   Consultants:   CCM  Procedures:   ECHO ; pending/   Antimicrobials:  Zosyn 10-09. Vancomycin    Subjective: He is alert, denies chest pain.  Confusion continue. Removed IV access yesterday.   Objective: Vitals:   12/25/15 2000 12/25/15 2348 12/26/15 0400 12/26/15 0512  BP: 120/69 (!) 157/85 (!) 148/71   Pulse: (!) 110 (!) 102 (!) 101   Resp: 19 (!) 30 (!) 22   Temp:  99.1 F (37.3 C) 98.9 F (37.2 C)   TempSrc:  Axillary Axillary   SpO2: 95% 94% 93% 96%  Weight:      Height:        Intake/Output Summary (Last 24 hours) at 12/26/15 0750 Last data filed at 12/26/15 0428  Gross per 24 hour  Intake          1565.13 ml  Output             1675 ml  Net          -109.87 ml   Filed Weights   12/24/15 1600  Weight: 125.2 kg (276 lb)    Examination:  General exam: Appears calm and comfortable  Respiratory system: Clear to auscultation. Respiratory effort normal. Cardiovascular system: S1 & S2 heard, RRR. No JVD, murmurs, rubs, gallops or clicks. No pedal edema. Gastrointestinal system: Abdomen is nondistended, soft and nontender. No organomegaly or masses felt. Normal bowel sounds heard. Central nervous system: Alert and oriented. Unable to perform due to AMS Extremities: Symmetric 5 x 5 power. Skin: No rashes, lesions or ulcers Psychiatry: now calm, was agitated overnight     Data Reviewed: I have personally reviewed  following labs and imaging studies  CBC:  Recent Labs Lab 12/24/15 1513 12/25/15 1034 12/26/15 0130  WBC 2.2* 14.9* 14.6*  NEUTROABS 1.8  --   --   HGB 15.1 11.7* 12.2*  HCT 47.3 36.0* 36.7*  MCV 94.0 91.4 90.0  PLT 147* 106* 129*   Basic Metabolic Panel:  Recent Labs Lab 12/24/15 1513 12/25/15 1034 12/26/15 0130  NA 141 142 139  K 3.9 3.6 3.8  CL 105 110 107  CO2 19* 23 21*  GLUCOSE 129* 156* 138*  BUN 16 19 18   CREATININE 1.23 1.15 0.98  CALCIUM 8.8* 7.8* 8.1*   GFR: Estimated Creatinine Clearance: 83.2 mL/min (by C-G formula based on SCr of 0.98 mg/dL). Liver Function Tests:  Recent Labs Lab 12/24/15 1513 12/25/15 1034  AST 49* 94*  ALT 26 61  ALKPHOS 56 37*  BILITOT 1.9* 1.4*  PROT 6.9 5.8*  ALBUMIN 4.4 3.2*   No results for input(s): LIPASE, AMYLASE in the last 168 hours.  Recent Labs Lab 12/25/15 [phone]  AMMONIA 18   Coagulation Profile:  Recent Labs Lab 12/25/15 0141  INR 1.44   Cardiac Enzymes:  Recent Labs Lab 12/25/15 0514 12/25/15 1034 12/25/15 1419 12/25/15 2149 12/26/15 0130  TROPONINI 1.24* 3.91* 5.15* 4.04* 4.18*   BNP (last 3 results) No results for input(s): PROBNP in the last 8760 hours. HbA1C: No results for input(s): HGBA1C in the last 72 hours. CBG:  Recent Labs Lab 12/24/15 1440  GLUCAP 115*   Lipid Profile: No results for input(s): CHOL, HDL, LDLCALC, TRIG, CHOLHDL, LDLDIRECT in the last 72 hours. Thyroid Function Tests:  Recent Labs  12/25/15 0210  TSH 1.154   Anemia Panel: No results for input(s): VITAMINB12, FOLATE, FERRITIN, TIBC, IRON, RETICCTPCT in the last 72 hours. Sepsis Labs:  Recent Labs Lab 12/25/15 0756 12/25/15 1034 12/25/15 1419 12/25/15 1641  LATICACIDVEN 4.7* 3.3* 2.3* 2.3*    Recent Results (from the past 240 hour(s))  Culture, blood (Routine x 2)     Status: None (Preliminary result)   Collection Time: 12/24/15  3:20 PM  Result Value Ref Range Status   Specimen  Description BLOOD RIGHT HAND  Final   Special Requests BOTTLES DRAWN AEROBIC AND ANAEROBIC 5CC  Final   Culture NO GROWTH < 24 HOURS  Final   Report Status PENDING  Incomplete  Culture, blood (Routine x 2)     Status: None (Preliminary result)   Collection Time: 12/24/15  3:50 PM  Result Value Ref Range Status   Specimen Description BLOOD LEFT HAND  Final   Special Requests IN PEDIATRIC BOTTLE 2CC  Final   Culture  Setup Time   Final    GRAM NEGATIVE RODS IN PEDIATRIC BOTTLE Organism ID to follow CRITICAL RESULT CALLED TO, READ BACK BY AND VERIFIED WITH: MLoretha Brasil.D. 9:50 12/25/15 ([email or web address])    Culture NO GROWTH < 24 HOURS  Final   Report Status PENDING  Incomplete  Blood Culture ID Panel (Reflexed)     Status: None   Collection Time: 12/24/15  3:50 PM  Result Value Ref Range Status   Enterococcus species NOT DETECTED NOT DETECTED Final   Listeria monocytogenes NOT DETECTED NOT DETECTED Final   Staphylococcus species NOT DETECTED NOT DETECTED Final   Staphylococcus aureus NOT DETECTED NOT DETECTED Final   Streptococcus species NOT DETECTED NOT DETECTED Final   Streptococcus agalactiae NOT DETECTED NOT DETECTED Final   Streptococcus pneumoniae NOT DETECTED NOT DETECTED Final   Streptococcus pyogenes NOT DETECTED NOT DETECTED Final   Acinetobacter baumannii NOT DETECTED NOT DETECTED Final   Enterobacteriaceae species NOT DETECTED NOT DETECTED Final   Enterobacter cloacae complex NOT DETECTED NOT DETECTED Final   Escherichia coli NOT DETECTED NOT DETECTED Final   Klebsiella oxytoca NOT DETECTED NOT DETECTED Final   Klebsiella pneumoniae NOT DETECTED NOT DETECTED Final   Proteus species NOT DETECTED NOT DETECTED Final   Serratia marcescens NOT DETECTED NOT DETECTED Final   Haemophilus influenzae NOT DETECTED NOT DETECTED Final   Neisseria meningitidis NOT DETECTED NOT DETECTED Final   Pseudomonas aeruginosa NOT DETECTED NOT DETECTED Final   Candida albicans NOT DETECTED NOT  DETECTED Final   Candida glabrata NOT DETECTED NOT DETECTED Final   Candida krusei NOT DETECTED NOT DETECTED Final   Candida parapsilosis NOT DETECTED NOT DETECTED Final   Candida tropicalis NOT DETECTED NOT DETECTED Final  Urine culture     Status: Abnormal (Preliminary result)   Collection Time: 12/24/15  5:30 PM  Result Value Ref Range Status   Specimen Description  URINE, RANDOM  Final   Special Requests NONE  Final   Culture 20,000 COLONIES/mL MORGANELLA MORGANII (A)  Final   Report Status PENDING  Incomplete  MRSA PCR Screening     Status: None   Collection Time: 12/25/15  4:10 AM  Result Value Ref Range Status   MRSA by PCR NEGATIVE NEGATIVE Final    Comment:        The GeneXpert MRSA Assay (FDA approved for NASAL specimens only), is one component of a comprehensive MRSA colonization surveillance program. It is not intended to diagnose MRSA infection nor to guide or monitor treatment for MRSA infections.          Radiology Studies: Ct Abdomen Pelvis Wo Contrast  Result Date: 12/24/2015 CLINICAL DATA:  Acute onset of sepsis.  Initial encounter. EXAM: CT CHEST, ABDOMEN AND PELVIS WITHOUT CONTRAST TECHNIQUE: Multidetector CT imaging of the chest, abdomen and pelvis was performed following the standard protocol without IV contrast. COMPARISON:  None. FINDINGS: CT CHEST FINDINGS Cardiovascular: Scattered coronary artery calcifications are seen. Calcification is noted at the aortic valve. Mild calcification is noted at the great vessels. The thoracic aorta is grossly unremarkable in appearance. The heart is normal in size. Mediastinum/Nodes: The mediastinum is otherwise grossly unremarkable. No mediastinal lymphadenopathy is seen. No pericardial effusion is identified. The visualized portions of the thyroid gland are unremarkable. No axillary lymphadenopathy is seen. Lungs/Pleura: Trace bilateral pleural fluid is noted, with patchy bibasilar opacities likely reflecting  atelectasis. No pneumothorax is identified. No dominant mass is seen. Evaluation is suboptimal due to motion artifact. Musculoskeletal: No acute osseous abnormalities are identified. The chest wall is grossly unremarkable in appearance. CT ABDOMEN PELVIS FINDINGS Hepatobiliary: The liver is unremarkable in appearance. The gallbladder is unremarkable in appearance. The common bile duct remains normal in caliber. Pancreas: The pancreas is within normal limits. Spleen: The spleen is unremarkable in appearance. Adrenals/Urinary Tract: The adrenal glands are unremarkable in appearance. Nonspecific perinephric stranding is noted bilaterally. Mild bilateral renal atrophy is noted. There is no evidence of hydronephrosis. No renal or ureteral stones are seen. Stranding about the left renal pelvis could reflect mild left-sided pyelonephritis. Stomach/Bowel: The stomach is unremarkable in appearance. The small bowel is within normal limits. The appendix is not visualized; there is no evidence for appendicitis. Minimal diverticulosis is noted along the proximal sigmoid colon, without evidence of diverticulitis. Vascular/Lymphatic: Scattered calcification is seen along the abdominal aorta and its branches. The abdominal aorta is otherwise grossly unremarkable. The inferior vena cava is grossly unremarkable. Visualized retroperitoneal nodes remain borderline normal in size. No pelvic sidewall lymphadenopathy is identified. Reproductive: The prostate is enlarged, measuring 5.4 cm in transverse dimension, with impression on the base of the bladder. The bladder is mildly distended and grossly unremarkable. Other: No additional soft tissue abnormalities are seen. Musculoskeletal: No acute osseous abnormalities are identified. The visualized musculature is unremarkable in appearance. IMPRESSION: 1. Stranding about the left renal pelvis could reflect mild left-sided pyelonephritis. Would correlate with the patient's symptoms. 2. Trace  bilateral pleural fluid, with patchy bibasilar airspace opacities likely reflecting atelectasis. Evaluation of the lung fields is suboptimal due to motion artifact. 3. Scattered coronary artery calcifications seen. Calcification noted at the aortic valve. 4. Mild bilateral renal atrophy noted. 5. Minimal diverticulosis along the proximal sigmoid colon, without evidence of diverticulitis. 6. Scattered aortic atherosclerosis noted. 7. Enlarged prostate noted, with impression on the base of the bladder. Would correlate with PSA. Electronically Signed   By: Beryle Beams.D.  On: 12/24/2015 20:50   Ct Chest Wo Contrast  Result Date: 12/24/2015 CLINICAL DATA:  Acute onset of sepsis.  Initial encounter. EXAM: CT CHEST, ABDOMEN AND PELVIS WITHOUT CONTRAST TECHNIQUE: Multidetector CT imaging of the chest, abdomen and pelvis was performed following the standard protocol without IV contrast. COMPARISON:  None. FINDINGS: CT CHEST FINDINGS Cardiovascular: Scattered coronary artery calcifications are seen. Calcification is noted at the aortic valve. Mild calcification is noted at the great vessels. The thoracic aorta is grossly unremarkable in appearance. The heart is normal in size. Mediastinum/Nodes: The mediastinum is otherwise grossly unremarkable. No mediastinal lymphadenopathy is seen. No pericardial effusion is identified. The visualized portions of the thyroid gland are unremarkable. No axillary lymphadenopathy is seen. Lungs/Pleura: Trace bilateral pleural fluid is noted, with patchy bibasilar opacities likely reflecting atelectasis. No pneumothorax is identified. No dominant mass is seen. Evaluation is suboptimal due to motion artifact. Musculoskeletal: No acute osseous abnormalities are identified. The chest wall is grossly unremarkable in appearance. CT ABDOMEN PELVIS FINDINGS Hepatobiliary: The liver is unremarkable in appearance. The gallbladder is unremarkable in appearance. The common bile duct remains  normal in caliber. Pancreas: The pancreas is within normal limits. Spleen: The spleen is unremarkable in appearance. Adrenals/Urinary Tract: The adrenal glands are unremarkable in appearance. Nonspecific perinephric stranding is noted bilaterally. Mild bilateral renal atrophy is noted. There is no evidence of hydronephrosis. No renal or ureteral stones are seen. Stranding about the left renal pelvis could reflect mild left-sided pyelonephritis. Stomach/Bowel: The stomach is unremarkable in appearance. The small bowel is within normal limits. The appendix is not visualized; there is no evidence for appendicitis. Minimal diverticulosis is noted along the proximal sigmoid colon, without evidence of diverticulitis. Vascular/Lymphatic: Scattered calcification is seen along the abdominal aorta and its branches. The abdominal aorta is otherwise grossly unremarkable. The inferior vena cava is grossly unremarkable. Visualized retroperitoneal nodes remain borderline normal in size. No pelvic sidewall lymphadenopathy is identified. Reproductive: The prostate is enlarged, measuring 5.4 cm in transverse dimension, with impression on the base of the bladder. The bladder is mildly distended and grossly unremarkable. Other: No additional soft tissue abnormalities are seen. Musculoskeletal: No acute osseous abnormalities are identified. The visualized musculature is unremarkable in appearance. IMPRESSION: 1. Stranding about the left renal pelvis could reflect mild left-sided pyelonephritis. Would correlate with the patient's symptoms. 2. Trace bilateral pleural fluid, with patchy bibasilar airspace opacities likely reflecting atelectasis. Evaluation of the lung fields is suboptimal due to motion artifact. 3. Scattered coronary artery calcifications seen. Calcification noted at the aortic valve. 4. Mild bilateral renal atrophy noted. 5. Minimal diverticulosis along the proximal sigmoid colon, without evidence of diverticulitis. 6.  Scattered aortic atherosclerosis noted. 7. Enlarged prostate noted, with impression on the base of the bladder. Would correlate with PSA. Electronically Signed   By: Roanna RaiderJeffery  Chang M.D.   On: 12/24/2015 20:50   Dg Chest Port 1 View  Result Date: 12/24/2015 CLINICAL DATA:  Sepsis and hypoxia EXAM: PORTABLE CHEST 1 VIEW COMPARISON:  03/12/2013 FINDINGS: Mild cardiac enlargement. Low lung volumes. Pulmonary vascular congestion noted. No pleural effusions or edema. No airspace consolidation. IMPRESSION: 1. Mild cardiac enlargement and pulmonary vascular congestion. Electronically Signed   By: Signa Kellaylor  Stroud M.D.   On: 12/24/2015 18:13        Scheduled Meds: . donepezil  10 mg Oral Daily  . dorzolamide  1 drop Both Eyes Daily  . finasteride  5 mg Oral Daily  . multivitamin with minerals  1 tablet Oral Daily  .  piperacillin-tazobactam (ZOSYN)  IV  3.375 g Intravenous Q8H  . potassium chloride  10 mEq Oral Daily  . simvastatin  20 mg Oral QPM  . timolol  1 drop Both Eyes BID   Continuous Infusions: . sodium chloride 75 mL/hr at 12/25/15 1940  . heparin 1,700 Units/hr (12/26/15 0455)     LOS: 2 days    Time spent: 35 minutes.     Alba Cory, MD [facility] Pager [phone]  If 7PM-7AM, please contact night-coverage [email or web address] Password [email or web address] 12/26/2015, 7:50 AM

## 2015-12-26 NOTE — Progress Notes (Signed)
ANTICOAGULATION CONSULT NOTE - F/U Consult  Pharmacy Consult for heparin Indication: chest pain/ACS  No Known Allergies  Patient Measurements: Height: 5' 11.65" (182 cm) Weight: 276 lb (125.2 kg) IBW/kg (Calculated) : 76.8 Heparin Dosing Weight: 103.4  Vital Signs: Temp: 99.6 F (37.6 C) (10/11 1959) Temp Source: Axillary (10/11 1959) BP: 111/69 (10/11 1959) Pulse Rate: 105 (10/11 1959)  Labs:  Recent Labs  12/24/15 1513 12/25/15 0141  12/25/15 1034 12/25/15 1419 12/25/15 2149 12/26/15 0130 12/26/15 0303 12/26/15 1101 12/26/15 2031  HGB 15.1  --   --  11.7*  --   --  12.2*  --   --   --   HCT 47.3  --   --  36.0*  --   --  36.7*  --   --   --   PLT 147*  --   --  106*  --   --  129*  --   --   --   APTT  --  29  --   --   --   --   --   --   --   --   LABPROT  --  17.7*  --   --   --   --   --   --   --   --   INR  --  1.44  --   --   --   --   --   --   --   --   HEPARINUNFRC  --   --   --   --   --   --   --  <0.10* <0.10* 0.51  CREATININE 1.23  --   --  1.15  --   --  0.98  --   --   --   TROPONINI  --  0.59*  < > 3.91* 5.15* 4.04* 4.18*  --   --   --   < > = values in this interval not displayed.  Estimated Creatinine Clearance: 83.2 mL/min (by C-G formula based on SCr of 0.98 mg/dL).  Assessment: CC: AMS, diaphoretic, incontinent, hematuria  PMH: HTN, CHF, HLD, BPH  Patient had trop elevation in setting of sepsis - IV hep x 48 hrs per Cards  Anticoag: none pta - Heparin level therapeutic 0.51  Nephro: SCr 0.98  Heme/Onc: H&H 12.2/36.7, Plt 129  Goal of Therapy:  Heparin level 0.3-0.7 units/ml Monitor platelets by anticoagulation protocol: Yes   Plan:  Continue Heparin infusion at 2000 units/hr Daily heparin level, cbc  Ruben Imony Teryn Boerema, PharmD Clinical Pharmacist Pager: [phone] 12/26/2015 9:51 PM

## 2015-12-26 NOTE — Plan of Care (Signed)
Problem: Safety: Goal: Ability to remain free from injury will improve Outcome: Not Progressing Pt has dementia at baseline and is not coping well with the equipment associated with his stay in the hospital.  He has continued to pull at lines and monitor leads.  Posey mitts have increased his agitation. The pts daughter is at bedside and is able to redirect in most cases.

## 2015-12-27 DIAGNOSIS — I5032 Chronic diastolic (congestive) heart failure: Secondary | ICD-10-CM | POA: Diagnosis present

## 2015-12-27 DIAGNOSIS — F0390 Unspecified dementia without behavioral disturbance: Secondary | ICD-10-CM | POA: Diagnosis present

## 2015-12-27 DIAGNOSIS — I248 Other forms of acute ischemic heart disease: Secondary | ICD-10-CM | POA: Diagnosis present

## 2015-12-27 LAB — BASIC METABOLIC PANEL
Anion gap: 8 (ref 5–15)
BUN: 12 mg/dL (ref 6–20)
CO2: 24 mmol/L (ref 22–32)
CREATININE: 0.81 mg/dL (ref 0.61–1.24)
Calcium: 8 mg/dL — ABNORMAL LOW (ref 8.9–10.3)
Chloride: 106 mmol/L (ref 101–111)
GFR calc Af Amer: >60 mL/min (ref >60)
GLUCOSE: 184 mg/dL — AB (ref 65–99)
Potassium: 4.1 mmol/L (ref 3.5–5.1)
SODIUM: 138 mmol/L (ref 135–145)

## 2015-12-27 LAB — CULTURE, BLOOD (ROUTINE X 2)

## 2015-12-27 LAB — CBC
HCT: 37 % — ABNORMAL LOW (ref 39.0–52.0)
Hemoglobin: 12.2 g/dL — ABNORMAL LOW (ref 13.0–17.0)
MCH: 29.5 pg (ref 26.0–34.0)
MCHC: 33 g/dL (ref 30.0–36.0)
MCV: 89.6 fL (ref 78.0–100.0)
PLATELETS: 125 10*3/uL — AB (ref 150–400)
RBC: 4.13 MIL/uL — ABNORMAL LOW (ref 4.22–5.81)
RDW: 13.2 % (ref 11.5–15.5)
WBC: 13.3 10*3/uL — ABNORMAL HIGH (ref 4.0–10.5)

## 2015-12-27 LAB — HEPARIN LEVEL (UNFRACTIONATED): HEPARIN UNFRACTIONATED: 0.44 [IU]/mL (ref 0.30–0.70)

## 2015-12-27 MED ORDER — DEXTROSE 5 % IV SOLN
1.0000 g | INTRAVENOUS | Status: DC
  Administered 2015-12-27 – 2015-12-29 (×3): 1 g via INTRAVENOUS
  Filled 2015-12-27 (×3): qty 10

## 2015-12-27 MED ORDER — FUROSEMIDE 40 MG PO TABS
40.0000 mg | ORAL_TABLET | Freq: Every day | ORAL | Status: DC
  Administered 2015-12-28 – 2015-12-29 (×2): 40 mg via ORAL
  Filled 2015-12-27 (×2): qty 1

## 2015-12-27 MED ORDER — FUROSEMIDE 10 MG/ML IJ SOLN
40.0000 mg | Freq: Once | INTRAMUSCULAR | Status: AC
  Administered 2015-12-27: 40 mg via INTRAVENOUS
  Filled 2015-12-27: qty 4

## 2015-12-27 NOTE — Progress Notes (Signed)
OT Cancellation Note  Patient Details Name: Daryl Salas MRN: [id number] DOB: 09/09/1933   Cancelled Treatment:    Reason Eval/Treat Not Completed: Other (comment)  Pt very fatigued falling alsleep mid sentence.  Pts daughter and son in law present Daughter stated pt had not any much sleep in 3 days.  Son in law is an OT at several SNF's. They want to take pt home rather than a SNF.  Will return tomorrow for OT eval.   Nicanor AlconREDDING, Jakita Dutkiewicz D  Lori Nyeemah Jennette, [place] [phone] 12/27/2015, 12:58 PM

## 2015-12-27 NOTE — Progress Notes (Signed)
Subjective:  Slowly recovering from sepsis.  No complaints of chest pain or shortness of breath, pleasantly demented.  Echo showed good LV function. He is having hematuria today and his urine bag.  Objective:  Vital Signs in the last 24 hours: BP (!) 154/93   Pulse 96   Temp 99.8 F (37.7 C) (Oral)   Resp (!) 25   Ht 5' 11.65" (1.82 m)   Wt 125.2 kg (276 lb)   SpO2 94%   BMI 37.79 kg/m   Physical Exam: Elderly white male no acute distress not oriented to place Lungs:  Clear Cardiac:  Regular rhythm, normal S1 and S2, no S3, 2/6 systolic murmur across the aortic valve Extremities:  No edema present, legs wrinkled  Intake/Output from previous day: 10/11 0701 - 10/12 0700 In: 515.2 [I.V.:365.2; IV Piggyback:150] Out: 3500 [Urine:3500]  Weight Filed Weights   12/24/15 1600  Weight: 125.2 kg (276 lb)    Lab Results: Basic Metabolic Panel:  Recent Labs  16/12/9608/11/17 0130 12/27/15 0239  NA 139 138  K 3.8 4.1  CL 107 106  CO2 21* 24  GLUCOSE 138* 184*  BUN 18 12  CREATININE 0.98 0.81   CBC:  Recent Labs  12/24/15 1513  12/26/15 0130 12/27/15 0239  WBC 2.2*  < > 14.6* 13.3*  NEUTROABS 1.8  --   --   --   HGB 15.1  < > 12.2* 12.2*  HCT 47.3  < > 36.7* 37.0*  MCV 94.0  < > 90.0 89.6  PLT 147*  < > 129* 125*  < > = values in this interval not displayed. Cardiac Enzymes: Cardiac Panel (last 3 results)  Recent Labs  12/25/15 1419 12/25/15 2149 12/26/15 0130  TROPONINI 5.15* 4.04* 4.18*    Telemetry: Sinus with PVCs  Assessment/Plan:  1.  Gram-negative rod sepsis slowly recovering 2.  Chronic diastolic heart failure 3.  Elevation of troponin likely due to demand ischemia of sepsis and hypotension 4.  Gross hematuria on anticoagulation  RECOMMENDATIONS:  Having significant hematuria today.  I would go ahead and stop heparin.  Once hematuria is cleared add Plavix to regimen that we will keep on for some time.  Continue treatment for sepsis.  Darden PalmerW.  Spencer Trustin Chapa, Jr.  MD [facility]  12/27/2015, 9:32 AM

## 2015-12-27 NOTE — Progress Notes (Signed)
ANTICOAGULATION CONSULT NOTE - F/U Consult  Pharmacy Consult for heparin Indication: chest pain/ACS  No Known Allergies  Patient Measurements: Height: 5' 11.65" (182 cm) Weight: 276 lb (125.2 kg) IBW/kg (Calculated) : 76.8 Heparin Dosing Weight: 103.4  Vital Signs: Temp: 99.3 F (37.4 C) (10/12 0515) Temp Source: Oral (10/12 0515) BP: 140/75 (10/12 0515) Pulse Rate: 98 (10/12 0515)  Labs:  Recent Labs  12/25/15 0141  12/25/15 1034 12/25/15 1419 12/25/15 2149 12/26/15 0130  12/26/15 1101 12/26/15 2031 12/27/15 0239  HGB  --   --  11.7*  --   --  12.2*  --   --   --  12.2*  HCT  --   --  36.0*  --   --  36.7*  --   --   --  37.0*  PLT  --   --  106*  --   --  129*  --   --   --  125*  APTT 29  --   --   --   --   --   --   --   --   --   LABPROT 17.7*  --   --   --   --   --   --   --   --   --   INR 1.44  --   --   --   --   --   --   --   --   --   HEPARINUNFRC  --   --   --   --   --   --   < > <0.10* 0.51 0.44  CREATININE  --   --  1.15  --   --  0.98  --   --   --  0.81  TROPONINI 0.59*  < > 3.91* 5.15* 4.04* 4.18*  --   --   --   --   < > = values in this interval not displayed.  Estimated Creatinine Clearance: 100.6 mL/min (by C-G formula based on SCr of 0.81 mg/dL).   Assessment: CC: AMS, diaphoretic, incontinent, hematuria  PMH: HTN, CHF, HLD, BPH  Patient had trop elevation in setting of sepsis - IV hep x 48 hrs per Cards  Anticoag: none pta - Therapeutic x 2 on 2000 units/hr  Nephro: SCr 0.81  Heme/Onc: H&H 12.2/37, Plt 125  Goal of Therapy:  Heparin level 0.3-0.7 units/ml Monitor platelets by anticoagulation protocol: Yes   Plan:  Continue heparin gtt at 2000 units/hr Daily hl cbc  Isaac BlissMichael Jayse Hodkinson, PharmD, BCPS, [facility] CenterBCCCP Clinical Pharmacist Pager [phone] 12/27/2015 7:54 AM

## 2015-12-27 NOTE — Care Management Note (Signed)
Case Management Note  Patient Details  Name: Daryl Salas MRN: [id number] Date of Birth: 07/08/1933  Subjective/Objective:   Pt lives with spouse, son and dtr-in-law live nearby and assist with care.  SNF has been recommended but dtr and wife want pt to return home with home health services, request RN and PT.  Provided list of agencies, dtr will talk with other relatives and provide name of agency.   Pt has walker, 3-N-1, and tub bench.                         Expected Discharge Plan:  Home w Home Health Services  Discharge planning Services  CM Consult  Post Acute Care Choice:  Home Health Choice offered to:  Adult Children  HH Arranged:  RN, PT HH Agency:     Status of Service:  In process, will continue to follow  Jamelle RushingMayo, Celsey Asselin T, RN 12/27/2015, 3:13 PM

## 2015-12-27 NOTE — Plan of Care (Signed)
Problem: Bowel/Gastric: Goal: Will not experience complications related to bowel motility Outcome: Progressing Patient successfully had a bowel movement with help from miralax.

## 2015-12-27 NOTE — Progress Notes (Signed)
PROGRESS NOTE    Daryl Salas  ZOX:[id number] DOB: 14-Apr-1933 DOA: 12/24/2015 PCP: Mickie Hillier, MD   Brief Narrative: Daryl Salas is a 82 y.o. male with medical history significant of HTN, CHF, HLD, BPH; who presents with complaints of bleeding from penis and altered mental status. History is obtained from the wife and daughter as the patient is acutely confused more than usual and unable to provide his own. Wife states that she saw the patient was bleeding from the tip his penis is a well urinating. Patient denied scratching his penis or having any other trauma to her knowledge. They took him to the urgent care while and while there the patient started yawning. It was unusual as the patient continued to do this and became pale and diaphoretic. The daughter had nursing care at urgent care facility check his blood pressure and states that his systolic BPs were in 70s. Over the last few days the family notes that the patient had had decreased by mouth intake and question if he was dehydrated. Family notes that the patient has been followed by Dr Viann Fish of cardiology and had previous echocardiograms in the past. They are not aware of his current heart function.  ED Course: Upon admission into the emergency department patient was seen to be hypothermic temperature of 96.13F then febrile up to 100.8, heart rates 109-125, respirations 14-31, blood pressure maintained 102/57/170 7/69, O2 saturations 18 on 3 L of nasal cannula oxygen. Lab work revealed WBC 2.2, hemoglobin 15.1, platelets 147, sodium 141, potassium 3.9, chloride 105, CO2 19, BUN 16, creatinine 1.22, lactic acid 8.77. Patient was given 3 L of IV fluids in the ED with vancomycin and Zosyn per sepsis protocol. However, patient required nasal cannula oxygen to maintain O2 saturations. In out catheter revealed initially for bloody then turn pinkish urine. TRH called to admit to stepdown.  Assessment & Plan:   Principal  Problem:   Sepsis (HCC) Active Problems:   Acute encephalopathy   BPH (benign prostatic hyperplasia)   Hematuria   Atrial fibrillation (HCC)   Acute pyelonephritis   Elevated troponin I level   Acute respiratory failure with hypoxia (HCC)   1-Sepsis, source could be UTI, CT finding consistent with Pyelonephritis and possible prostatitis. Gram Negative Bacteremia;  Presents with Lactic acid at 8--trending down to 2.3 He has received 3 L IV fluids.  Continue with IV Antibiotics, Change Zosyn to ceftriaxone.  blood culture ; Growing Morganella sensitive to zosyn, ceftriaxone Appreciate CCM assistance. BP remain stable.  Day 4 antibiotics.   2-Acute Respiratory Failure: At baseline patient not on oxygen at home patient required at least 3 L nasal cannula oxygen to maintain O2 sats following IV fluid hydration for sepsis protocol. - continue with oxygen supplementation.  -PRN Nebulize treatments.  -will give one time dose of IV lasix.   3-N-STEMI, Elevation of troponin;  Denies chest pain. Troponin Peak to 5--trending down 4.  ECHO normal EF, no wall motion abnormalities, diastolic dysfunction grade 1.  Stop heparin/ start  plavix when urine is clear. Marland Kitchen  Resume metoprolol, on 10-11  4-Hematuria: Acute. Related to cystitis vs prostatitis.  - Place three-way Foley catheter with coude tip for BPH - Monitor bleeding from meatus -Appreciate Urology evaluation.  -irrigation PRN.  -plan to dc cathter when hematuria is clear   Possible atrial fibrillation: Acute. no previous history although cannot be completely sure given a lot of artifact on EKG.  - continue to monitor -TSH norma.  -  repeat EKG. -resume metoprolol  Essential hypertension: Hold benazepril and resume metropolol   Congestive heart failure with possible acute exacerbation :  Patient was initially given 3 L of IV fluids for sepsis. Patient requiring nasal cannula oxygen of 3 L to maintain O2 saturations with some  tachypnea. Chest x-ray showing cardiomegaly with signs of central congestion possibly after first liter of fluids. - Strict In and Outs Resume oral lasix.   Acute encephalopathy/Dementia with behavioral disturbance  - haldol help with agitation. Will order PRN haldol.  - Continued donepezil once able - Changed patient to NPO, speech therapy to eval and treat  BPH -  Continue Proscar once able  Transaminases; follow trend.    DVT prophylaxis: SCD Code Status: Full code.  Family Communication: Daughter at bedside. Daughter would like to take patient home  Disposition Plan: remain inpatient   Consultants:   CCM  Procedures:   ECHO ; normal ef  Antimicrobials:  Zosyn 10-09. Vancomycin    Subjective: He is alert, denies chest pain.    Objective: Vitals:   12/26/15 1959 12/27/15 0049 12/27/15 0515 12/27/15 0818  BP: 111/69 (!) 144/91 140/75 (!) 125/95  Pulse: (!) 105 (!) 115 98 95  Resp: (!) 26 (!) 29 (!) 21 (!) 25  Temp: 99.6 F (37.6 C) 99.9 F (37.7 C) 99.3 F (37.4 C) 99.8 F (37.7 C)  TempSrc: Axillary Oral Oral Oral  SpO2: 97% 93% 92% 94%  Weight:      Height:        Intake/Output Summary (Last 24 hours) at 12/27/15 0911 Last data filed at 12/27/15 0556  Gross per 24 hour  Intake           465.23 ml  Output             3500 ml  Net         -3034.77 ml   Filed Weights   12/24/15 1600  Weight: 125.2 kg (276 lb)    Examination:  General exam: Appears calm and comfortable  Respiratory system: Clear to auscultation. Respiratory effort normal. Cardiovascular system: S1 & S2 heard, RRR. No JVD, murmurs, rubs, gallops or clicks. No pedal edema. Gastrointestinal system: Abdomen is nondistended, soft and nontender. No organomegaly or masses felt. Normal bowel sounds heard. Central nervous system: Alert and oriented. Unable to perform due to AMS Extremities: Symmetric 5 x 5 power. Skin: No rashes, lesions or ulcers Psychiatry: now calm, was agitated  overnight     Data Reviewed: I have personally reviewed following labs and imaging studies  CBC:  Recent Labs Lab 12/24/15 1513 12/25/15 1034 12/26/15 0130 12/27/15 0239  WBC 2.2* 14.9* 14.6* 13.3*  NEUTROABS 1.8  --   --   --   HGB 15.1 11.7* 12.2* 12.2*  HCT 47.3 36.0* 36.7* 37.0*  MCV 94.0 91.4 90.0 89.6  PLT 147* 106* 129* 125*   Basic Metabolic Panel:  Recent Labs Lab 12/24/15 1513 12/25/15 1034 12/26/15 0130 12/27/15 0239  NA 141 142 139 138  K 3.9 3.6 3.8 4.1  CL 105 110 107 106  CO2 19* 23 21* 24  GLUCOSE 129* 156* 138* 184*  BUN 16 19 18 12   CREATININE 1.23 1.15 0.98 0.81  CALCIUM 8.8* 7.8* 8.1* 8.0*   GFR: Estimated Creatinine Clearance: 100.6 mL/min (by C-G formula based on SCr of 0.81 mg/dL). Liver Function Tests:  Recent Labs Lab 12/24/15 1513 12/25/15 1034  AST 49* 94*  ALT 26 61  ALKPHOS 56 37*  BILITOT 1.9* 1.4*  PROT 6.9 5.8*  ALBUMIN 4.4 3.2*   No results for input(s): LIPASE, AMYLASE in the last 168 hours.  Recent Labs Lab 12/25/15 0758  AMMONIA 18   Coagulation Profile:  Recent Labs Lab 12/25/15 0141  INR 1.44   Cardiac Enzymes:  Recent Labs Lab 12/25/15 0514 12/25/15 1034 12/25/15 1419 12/25/15 2149 12/26/15 0130  TROPONINI 1.24* 3.91* 5.15* 4.04* 4.18*   BNP (last 3 results) No results for input(s): PROBNP in the last 8760 hours. HbA1C: No results for input(s): HGBA1C in the last 72 hours. CBG:  Recent Labs Lab 12/24/15 1440  GLUCAP 115*   Lipid Profile: No results for input(s): CHOL, HDL, LDLCALC, TRIG, CHOLHDL, LDLDIRECT in the last 72 hours. Thyroid Function Tests:  Recent Labs  12/25/15 0210  TSH 1.154   Anemia Panel: No results for input(s): VITAMINB12, FOLATE, FERRITIN, TIBC, IRON, RETICCTPCT in the last 72 hours. Sepsis Labs:  Recent Labs Lab 12/25/15 0756 12/25/15 1034 12/25/15 1419 12/25/15 1641  LATICACIDVEN 4.7* 3.3* 2.3* 2.3*    Recent Results (from the past 240 hour(s))    Culture, blood (Routine x 2)     Status: None (Preliminary result)   Collection Time: 12/24/15  3:20 PM  Result Value Ref Range Status   Specimen Description BLOOD RIGHT HAND  Final   Special Requests BOTTLES DRAWN AEROBIC AND ANAEROBIC 5CC  Final   Culture NO GROWTH 2 DAYS  Final   Report Status PENDING  Incomplete  Culture, blood (Routine x 2)     Status: Abnormal (Preliminary result)   Collection Time: 12/24/15  3:50 PM  Result Value Ref Range Status   Specimen Description BLOOD LEFT HAND  Final   Special Requests IN PEDIATRIC BOTTLE 2CC  Final   Culture  Setup Time   Final    GRAM NEGATIVE RODS IN PEDIATRIC BOTTLE Organism ID to follow CRITICAL RESULT CALLED TO, READ BACK BY AND VERIFIED WITH: MLoretha Brasil. Turner Pharm.D. 9:50 12/25/15 ([email or web address])    Culture (A)  Final    MORGANELLA MORGANII IDENTIFICATION AND SUSCEPTIBILITIES TO FOLLOW    Report Status PENDING  Incomplete  Blood Culture ID Panel (Reflexed)     Status: None   Collection Time: 12/24/15  3:50 PM  Result Value Ref Range Status   Enterococcus species NOT DETECTED NOT DETECTED Final   Listeria monocytogenes NOT DETECTED NOT DETECTED Final   Staphylococcus species NOT DETECTED NOT DETECTED Final   Staphylococcus aureus NOT DETECTED NOT DETECTED Final   Streptococcus species NOT DETECTED NOT DETECTED Final   Streptococcus agalactiae NOT DETECTED NOT DETECTED Final   Streptococcus pneumoniae NOT DETECTED NOT DETECTED Final   Streptococcus pyogenes NOT DETECTED NOT DETECTED Final   Acinetobacter baumannii NOT DETECTED NOT DETECTED Final   Enterobacteriaceae species NOT DETECTED NOT DETECTED Final   Enterobacter cloacae complex NOT DETECTED NOT DETECTED Final   Escherichia coli NOT DETECTED NOT DETECTED Final   Klebsiella oxytoca NOT DETECTED NOT DETECTED Final   Klebsiella pneumoniae NOT DETECTED NOT DETECTED Final   Proteus species NOT DETECTED NOT DETECTED Final   Serratia marcescens NOT DETECTED NOT DETECTED Final    Haemophilus influenzae NOT DETECTED NOT DETECTED Final   Neisseria meningitidis NOT DETECTED NOT DETECTED Final   Pseudomonas aeruginosa NOT DETECTED NOT DETECTED Final   Candida albicans NOT DETECTED NOT DETECTED Final   Candida glabrata NOT DETECTED NOT DETECTED Final   Candida krusei NOT DETECTED NOT DETECTED Final   Candida parapsilosis NOT DETECTED NOT DETECTED Final  Candida tropicalis NOT DETECTED NOT DETECTED Final  Urine culture     Status: Abnormal   Collection Time: 12/24/15  5:30 PM  Result Value Ref Range Status   Specimen Description URINE, RANDOM  Final   Special Requests NONE  Final   Culture 20,000 COLONIES/mL MORGANELLA MORGANII (A)  Final   Report Status 12/26/2015 FINAL  Final   Organism ID, Bacteria MORGANELLA MORGANII (A)  Final      Susceptibility   Morganella morganii - MIC*    AMPICILLIN >=32 RESISTANT Resistant     CEFAZOLIN >=64 RESISTANT Resistant     CEFTRIAXONE <=1 SENSITIVE Sensitive     CIPROFLOXACIN <=0.25 SENSITIVE Sensitive     GENTAMICIN <=1 SENSITIVE Sensitive     IMIPENEM 4 SENSITIVE Sensitive     NITROFURANTOIN 128 RESISTANT Resistant     TRIMETH/SULFA <=20 SENSITIVE Sensitive     AMPICILLIN/SULBACTAM 16 INTERMEDIATE Intermediate     PIP/TAZO <=4 SENSITIVE Sensitive     * 20,000 COLONIES/mL MORGANELLA MORGANII  MRSA PCR Screening     Status: None   Collection Time: 12/25/15  4:10 AM  Result Value Ref Range Status   MRSA by PCR NEGATIVE NEGATIVE Final    Comment:        The GeneXpert MRSA Assay (FDA approved for NASAL specimens only), is one component of a comprehensive MRSA colonization surveillance program. It is not intended to diagnose MRSA infection nor to guide or monitor treatment for MRSA infections.          Radiology Studies: No results found.      Scheduled Meds: . donepezil  10 mg Oral Daily  . dorzolamide  1 drop Both Eyes Daily  . feeding supplement (ENSURE ENLIVE)  237 mL Oral BID BM  . finasteride  5  mg Oral Daily  . furosemide  40 mg Oral Daily  . metoprolol succinate  25 mg Oral Daily  . multivitamin with minerals  1 tablet Oral Daily  . piperacillin-tazobactam (ZOSYN)  IV  3.375 g Intravenous Q8H  . polyethylene glycol  17 g Oral Daily  . potassium chloride  10 mEq Oral Daily  . simvastatin  20 mg Oral QPM  . timolol  1 drop Both Eyes BID   Continuous Infusions: . heparin 2,000 Units/hr (12/26/15 2319)     LOS: 3 days    Time spent: 35 minutes.     Alba Cory, MD [facility] Pager [phone]  If 7PM-7AM, please contact night-coverage [email or web address] Password [email or web address] 12/27/2015, 9:11 AM

## 2015-12-27 NOTE — Progress Notes (Signed)
Per OT note, family would like to take patient home rather than go to snf.   CSW signing off.  Osborne Cascoadia Adelyn Roscher LCSWA [phone]

## 2015-12-28 LAB — BASIC METABOLIC PANEL
ANION GAP: 12 (ref 5–15)
BUN: 14 mg/dL (ref 6–20)
CALCIUM: 8.7 mg/dL — AB (ref 8.9–10.3)
CO2: 23 mmol/L (ref 22–32)
Chloride: 107 mmol/L (ref 101–111)
Creatinine, Ser: 0.92 mg/dL (ref 0.61–1.24)
GFR calc Af Amer: >60 mL/min (ref >60)
GLUCOSE: 270 mg/dL — AB (ref 65–99)
POTASSIUM: 3.7 mmol/L (ref 3.5–5.1)
SODIUM: 142 mmol/L (ref 135–145)

## 2015-12-28 LAB — CBC
HCT: 39.2 % (ref 39.0–52.0)
Hemoglobin: 13 g/dL (ref 13.0–17.0)
MCH: 29.6 pg (ref 26.0–34.0)
MCHC: 33.2 g/dL (ref 30.0–36.0)
MCV: 89.3 fL (ref 78.0–100.0)
PLATELETS: 172 10*3/uL (ref 150–400)
RBC: 4.39 MIL/uL (ref 4.22–5.81)
RDW: 13.3 % (ref 11.5–15.5)
WBC: 12.6 10*3/uL — AB (ref 4.0–10.5)

## 2015-12-28 LAB — MAGNESIUM: Magnesium: 1.9 mg/dL (ref 1.7–2.4)

## 2015-12-28 MED ORDER — SODIUM CHLORIDE 0.9 % IV BOLUS (SEPSIS)
500.0000 mL | Freq: Once | INTRAVENOUS | Status: AC
  Administered 2015-12-28: 500 mL via INTRAVENOUS

## 2015-12-28 MED ORDER — METOPROLOL SUCCINATE ER 25 MG PO TB24
50.0000 mg | ORAL_TABLET | Freq: Every day | ORAL | Status: DC
  Administered 2015-12-28 – 2015-12-29 (×2): 50 mg via ORAL
  Filled 2015-12-28 (×2): qty 2

## 2015-12-28 NOTE — Progress Notes (Signed)
Subjective: Mr. Daryl Salas is admitted for hematuria with sepsis and is being treated for a Morganella UTI.  He has a foley and the urine was reported as clearing yesterday.  There is no urine in the tube or bag this morning and he has urgency with suprapubic discomfort.   He is demented and doesn't respond clearly to questioning but his daughter is at his side.  ROS:  Review of Systems  Unable to perform ROS: Dementia    Anti-infectives: Anti-infectives    Start     Dose/Rate Route Frequency Ordered Stop   12/27/15 1000  cefTRIAXone (ROCEPHIN) 1 g in dextrose 5 % 50 mL IVPB     1 g 100 mL/hr over 30 Minutes Intravenous Every 24 hours 12/27/15 0913     12/25/15 1600  vancomycin (VANCOCIN) 1,500 mg in sodium chloride 0.9 % 500 mL IVPB  Status:  Discontinued     1,500 mg 250 mL/hr over 120 Minutes Intravenous Every 24 hours 12/24/15 1617 12/25/15 1017   12/25/15 0000  piperacillin-tazobactam (ZOSYN) IVPB 3.375 g  Status:  Discontinued     3.375 g 12.5 mL/hr over 240 Minutes Intravenous Every 8 hours 12/24/15 1617 12/27/15 0913   12/24/15 1600  vancomycin (VANCOCIN) 2,000 mg in sodium chloride 0.9 % 500 mL IVPB     2,000 mg 250 mL/hr over 120 Minutes Intravenous  Once 12/24/15 1535 12/24/15 1859   12/24/15 1545  piperacillin-tazobactam (ZOSYN) IVPB 3.375 g     3.375 g 100 mL/hr over 30 Minutes Intravenous  Once 12/24/15 1530 12/24/15 1618   12/24/15 1545  vancomycin (VANCOCIN) IVPB 1000 mg/200 mL premix  Status:  Discontinued     1,000 mg 200 mL/hr over 60 Minutes Intravenous  Once 12/24/15 1530 12/24/15 1535      Current Facility-Administered Medications  Medication Dose Route Frequency Provider Last Rate Last Dose  . acetaminophen (TYLENOL) tablet 650 mg  650 mg Oral Q6H PRN Clydie Braunondell A Smith, MD       Or  . acetaminophen (TYLENOL) suppository 650 mg  650 mg Rectal Q6H PRN Clydie Braunondell A Smith, MD      . cefTRIAXone (ROCEPHIN) 1 g in dextrose 5 % 50 mL IVPB  1 g Intravenous Q24H Belkys  A Regalado, MD   1 g at 12/27/15 1101  . donepezil (ARICEPT) tablet 10 mg  10 mg Oral Daily Clydie Braunondell A Smith, MD   10 mg at 12/27/15 0918  . dorzolamide (TRUSOPT) 2 % ophthalmic solution 1 drop  1 drop Both Eyes Daily Clydie Braunondell A Smith, MD   1 drop at 12/27/15 0919  . feeding supplement (ENSURE ENLIVE) (ENSURE ENLIVE) liquid 237 mL  237 mL Oral BID BM Belkys A Regalado, MD   237 mL at 12/26/15 1225  . finasteride (PROSCAR) tablet 5 mg  5 mg Oral Daily Rondell A Katrinka BlazingSmith, MD   5 mg at 12/27/15 0918  . furosemide (LASIX) tablet 40 mg  40 mg Oral Daily Belkys A Regalado, MD      . haloperidol lactate (HALDOL) injection 1 mg  1 mg Intravenous Q6H PRN Belkys A Regalado, MD   1 mg at 12/28/15 0043  . ipratropium-albuterol (DUONEB) 0.5-2.5 (3) MG/3ML nebulizer solution 3 mL  3 mL Nebulization Q4H PRN Clydie Braunondell A Smith, MD   3 mL at 12/26/15 1730  . metoprolol succinate (TOPROL-XL) 24 hr tablet 25 mg  25 mg Oral Daily Belkys A Regalado, MD   25 mg at 12/27/15 0918  . multivitamin with minerals  tablet 1 tablet  1 tablet Oral Daily Clydie Braun, MD   1 tablet at 12/27/15 [phone]  . ondansetron (ZOFRAN) tablet 4 mg  4 mg Oral Q6H PRN Clydie Braun, MD       Or  . ondansetron (ZOFRAN) injection 4 mg  4 mg Intravenous Q6H PRN Rondell A Smith, MD      . polyethylene glycol (MIRALAX / GLYCOLAX) packet 17 g  17 g Oral Daily Belkys A Regalado, MD   17 g at 12/27/15 0911  . potassium chloride (K-DUR,KLOR-CON) CR tablet 10 mEq  10 mEq Oral Daily Clydie Braun, MD   10 mEq at 12/27/15 0919  . simvastatin (ZOCOR) tablet 20 mg  20 mg Oral QPM Clydie Braun, MD   20 mg at 12/27/15 1857  . timolol (TIMOPTIC) 0.5 % ophthalmic solution 1 drop  1 drop Both Eyes BID Clydie Braun, MD   1 drop at 12/27/15 2144     Objective: Vital signs in last 24 hours: Temp:  [98.7 F (37.1 C)-99.8 F (37.7 C)] 98.7 F (37.1 C) (10/13 0500) Pulse Rate:  [40-121] 102 (10/13 0500) Resp:  [19-37] 20 (10/13 0500) BP: (120-196)/(62-113)  144/85 (10/13 0500) SpO2:  [90 %-100 %] 96 % (10/13 0500)  Intake/Output from previous day: 10/12 0701 - 10/13 0700 In: 550 [IV Piggyback:550] Out: 2700 [Urine:2700] Intake/Output this shift: No intake/output data recorded.   Physical Exam  Constitutional: He is well-developed, well-nourished, and in no distress.  Lying in bed  Abdominal:  Suprapubic tenderness and fullness noted.   Genitourinary:  Genitourinary Comments: Foley indwelling with no urine in tube or bag.     Lab Results:   Recent Labs  12/27/15 0239 12/28/15 0236  WBC 13.3* 12.6*  HGB 12.2* 13.0  HCT 37.0* 39.2  PLT 125* 172   BMET  Recent Labs  12/26/15 0130 12/27/15 0239  NA 139 138  K 3.8 4.1  CL 107 106  CO2 21* 24  GLUCOSE 138* 184*  BUN 18 12  CREATININE 0.98 0.81  CALCIUM 8.1* 8.0*   PT/INR No results for input(s): LABPROT, INR in the last 72 hours. ABG No results for input(s): PHART, HCO3 in the last 72 hours.  Invalid input(s): PCO2, PO2  Studies/Results: No results found.   Labs, notes and CT report reviewed.  Catheter irrigated with NS with return of small clot and about of slightly amber urine.   Further irrigation produced no additional clots.  Assessment and Plan: Sepsis with hematuria probably from the prostate with clot retention this morning.  I would recommend a voiding trial in the morning with PVR checks q4-6 hrs x 24 hr to insure emptying.       LOS: 4 days    Anner Crete 12/28/2015 [phone] ID: Elliot Gault, male   DOB: 08-Sep-1933, 82 y.o.   MRN: [id number]

## 2015-12-28 NOTE — Evaluation (Signed)
Occupational Therapy Evaluation Patient Details Name: Daryl Salas MRN: [id number] DOB: 03-15-1934 Today's Date: 12/28/2015    History of Present Illness Daryl Salas is a 82 year old with past medical history of hypertension, hyperlipidemia, dementia. He was admitted yesterday with bleeding from the tip of the penis. He was noted to be hypotensive in urgent care and brought to [facility] ED. When he was found to have leukopenia, elevated lactic acid, hypertension, borderline urinalysis. He had a CT of the abdomen pelvis which showed possible pyelonephritis. Dx with sepsis   Clinical Impression   Pt was admitted for the above.  At baseline, he and wife manage his ADLs together. He currently needs max +2 assistance for all ADLs.  He will benefit from continued OT in acute and following this hospital stay.  Of note, his son-in-law is an OT.  Family is very supportive, and they feel they can manage him at home. Daughter was present during the evaluation and was very hands-on.  Focus of OT will be on mobility supporting ADLs so family can manage more easily.    Follow Up Recommendations  Supervision/Assistance - 24 hour;Home health OT (vs SNF.  Family wants to take pt home: family member = OT)    Equipment Recommendations  Hospital bed;Wheelchair (measurements OT) (mechanical lift)    Recommendations for Other Services       Precautions / Restrictions Precautions Precautions: Fall Precaution Comments: monitor hr Restrictions Weight Bearing Restrictions: No      Mobility Bed Mobility         Supine to sit: Max assist;+2 for physical assistance     General bed mobility comments: assist for legs and trunk. Pt initiated movement with extra time.  Utilized pad to assist with scooting forward  Transfers   Equipment used: Rolling walker (2 wheeled) (and sara stedy)   Sit to Stand: Max assist;+2 physical assistance         General transfer comment: pt initiated  movement; cues for UE/LE placement and assist to rise, shift hips anteriorly and stabilize    Balance     Sitting balance-Leahy Scale: Poor Sitting balance - Comments: min to mod A to maintain balance. Short spurts of min guard Postural control: Posterior lean Standing balance support: Bilateral upper extremity supported Standing balance-Leahy Scale: Zero                              ADL Overall ADL's : Needs assistance/impaired Eating/Feeding: Maximal assistance;Sitting;Bed level (fatiques easily)   Grooming: Wash/dry face;Set up;Sitting   Upper Body Bathing: Maximal assistance;Bed level   Lower Body Bathing: Total assistance;Bed level   Upper Body Dressing : Moderate assistance;Bed level   Lower Body Dressing: Total assistance;Bed level                 General ADL Comments: pt with generalized weakness and decreased endurance.  Stood 2xs:  once with RW and once with steady.  Pt had difficulty extending trunk.  Initiated standing but needed max +2 assistance.  Used stedy to transfer to chair.  Daughter present and assisting.  She was feeding him when we arrived (due to decreased endurance).  Pt took several sips of orange juice at EOB.  hr 102-144  sats remained in 90s on RA     Vision     Perception     Praxis      Pertinent Vitals/Pain Pain Assessment: Faces Faces Pain Scale: Hurts even  more Pain Location: LLE Pain Intervention(s): Limited activity within patient's tolerance;Monitored during session;Repositioned     Hand Dominance     Extremity/Trunk Assessment Upper Extremity Assessment Upper Extremity Assessment: Generalized weakness.  Bil hand grip is very good           Communication Communication Communication: No difficulties   Cognition Arousal/Alertness: Awake/alert Behavior During Therapy: WFL for tasks assessed/performed Overall Cognitive Status:  (followed all commands, sometimes multiple cues)         Following Commands:  Follows one step commands with increased time       General Comments: pt has dementia at baseline; daughter present and he responded well to basic commands   General Comments       Exercises       Shoulder Instructions      Home Living Family/patient expects to be discharged to:: Private residence Living Arrangements: Spouse/significant other Available Help at Discharge: Family;Available 24 hours/day               Bathroom Shower/Tub: Producer, television/film/videoWalk-in shower   Bathroom Toilet: Standard     Home Equipment: Bedside commode;Shower seat;Walker - 2 wheels          Prior Functioning/Environment Level of Independence: Needs assistance    ADL's / Homemaking Assistance Needed: wife helped with bathing and dressing using shower chair            OT Problem List: Decreased strength;Decreased activity tolerance;Impaired balance (sitting and/or standing);Decreased cognition;Decreased knowledge of use of DME or AE;Pain;Cardiopulmonary status limiting activity   OT Treatment/Interventions: Self-care/ADL training;DME and/or AE instruction;Balance training;Patient/family education;Therapeutic activities    OT Goals(Current goals can be found in the care plan section) Acute Rehab OT Goals Patient Stated Goal: home OT Goal Formulation: With patient/family Time For Goal Achievement: 01/11/16 Potential to Achieve Goals: Good ADL Goals Pt Will Transfer to Toilet: with max assist;with +2 assist;stand pivot transfer;bedside commode Additional ADL Goal #1: pt will go from sit to stand with mod A +2 for adls and maintain with mod A for 2 minutes Additional ADL Goal #2: pt will perform bed mobility with mod A with HOB raised, extra time and mod cues Additional ADL Goal #3: pt will sit eob x 5 minutes with min guard in preparation for adls  OT Frequency: Min 2X/week   Barriers to D/C:            Co-evaluation PT/OT/SLP Co-Evaluation/Treatment: Yes Reason for Co-Treatment: For  patient/therapist safety PT goals addressed during session: Mobility/safety with mobility OT goals addressed during session: ADL's and self-care      End of Session Nurse Communication: Mobility status;Need for lift equipment  Activity Tolerance: Patient tolerated treatment well Patient left: in chair;with call bell/phone within reach;with chair alarm set;with family/visitor present   Time: 0917-1000 OT Time Calculation (min): 43 min Charges:  OT General Charges $OT Visit: 1 Procedure OT Evaluation $OT Eval Moderate Complexity: 1 Procedure G-Codes:    Seanpaul Preece 12/28/2015, 11:02 AM  Marica OtterMaryellen Debera Sterba, OTR/L [phone] 12/28/2015

## 2015-12-28 NOTE — Progress Notes (Signed)
500 ml bolus given for HR 130's St with pair of PVC

## 2015-12-28 NOTE — Care Management Note (Addendum)
Case Management Note  Patient Details  Name: Elliot GaultDominic M Edgar MRN: [id number] Date of Birth: 12/27/1933  Subjective/Objective:   Family has reviewed list of home health agencies and chosen [facility] - liaison notified.  Pt will also need wheelchair, which has been ordered - family declines hospital bed and hoyer lift.                           Expected Discharge Plan:  Home w Home Health Services  Discharge planning Services  CM Consult  Post Acute Care Choice:  Home Health Choice offered to:  Adult Children  DME Arranged:  Government social research officerWheelchair manual DME Agency:  [facility]  HH Arranged[facility]  [facility]  Status of Service:  Completed, signed off  Magdalene RiverMayo, Kentrell Guettler T, [place]RN 12/28/2015, 10:07 AM

## 2015-12-28 NOTE — Progress Notes (Signed)
PROGRESS NOTE    Daryl Salas  ZOX:[id number] DOB: March 30, 1933 DOA: 12/24/2015 PCP: Mickie Hillier, MD   Brief Narrative: Daryl Salas is a 82 y.o. male with medical history significant of HTN, CHF, HLD, BPH; who presents with complaints of bleeding from penis and altered mental status. History is obtained from the wife and daughter as the patient is acutely confused more than usual and unable to provide his own. Wife states that she saw the patient was bleeding from the tip his penis is a well urinating. Patient denied scratching his penis or having any other trauma to her knowledge. They took him to the urgent care while and while there the patient started yawning. It was unusual as the patient continued to do this and became pale and diaphoretic. The daughter had nursing care at urgent care facility check his blood pressure and states that his systolic BPs were in 70s. Over the last few days the family notes that the patient had had decreased by mouth intake and question if he was dehydrated. Family notes that the patient has been followed by Dr Viann Fish of cardiology and had previous echocardiograms in the past. They are not aware of his current heart function.  ED Course: Upon admission into the emergency department patient was seen to be hypothermic temperature of 96.13F then febrile up to 100.8, heart rates 109-125, respirations 14-31, blood pressure maintained 102/57/170 7/69, O2 saturations 18 on 3 L of nasal cannula oxygen. Lab work revealed WBC 2.2, hemoglobin 15.1, platelets 147, sodium 141, potassium 3.9, chloride 105, CO2 19, BUN 16, creatinine 1.22, lactic acid 8.77. Patient was given 3 L of IV fluids in the ED with vancomycin and Zosyn per sepsis protocol. However, patient required nasal cannula oxygen to maintain O2 saturations. In out catheter revealed initially for bloody then turn pinkish urine. TRH called to admit to stepdown.  Assessment & Plan:   Principal  Problem:   Sepsis (HCC) Active Problems:   Acute encephalopathy   Hematuria   Acute pyelonephritis   Elevated troponin I level   Acute respiratory failure with hypoxia (HCC)   Chronic diastolic heart failure (HCC)   Dementia   Demand ischemia (HCC)   1-Sepsis, source could be UTI, CT finding consistent with Pyelonephritis and possible prostatitis. Gram Negative Bacteremia;  Presents with Lactic acid at 8--trending down to 2.3 He has received 3 L IV fluids.  Continue with IV Antibiotics, Change Zosyn to ceftriaxone.  blood culture ; Growing Morganella sensitive to zosyn, ceftriaxone Appreciate CCM assistance. BP remain stable.  Day 5 antibiotics. Plan to discharge on oral antibiotics.   2-Acute Respiratory Failure: At baseline patient not on oxygen at home patient required at least 3 L nasal cannula oxygen to maintain O2 sats following IV fluid hydration for sepsis protocol. - continue with oxygen supplementation.  -PRN Nebulize treatments.  -Continue home dose of lasix.   3-N-STEMI, Elevation of troponin;  Denies chest pain. Troponin Peak to 5--trending down 4.  ECHO normal EF, no wall motion abnormalities, diastolic dysfunction grade 1.  Stop heparin/ start  plavix when urine is clear. Marland Kitchen  Resume metoprolol, on 10-11. Increased dose today due to elevated HR  4-Hematuria: Acute. Related to cystitis vs prostatitis.  - Place three-way Foley catheter with coude tip for BPH - Monitor bleeding from meatus -Appreciate Urology evaluation.  -irrigation PRN.  Plan to DC foley catheter today   Possible atrial fibrillation: Acute. no previous history although cannot be completely sure given a lot  of artifact on EKG.  - continue to monitor -TSH norma.  - repeat EKG. -resume metoprolol  Essential hypertension: Hold benazepril and resume metropolol   Congestive heart failure with possible acute exacerbation :  Patient was initially given 3 L of IV fluids for sepsis. Patient  requiring nasal cannula oxygen of 3 L to maintain O2 saturations with some tachypnea. Chest x-ray showing cardiomegaly with signs of central congestion possibly after first liter of fluids. - Strict In and Outs Resume oral lasix.   Acute encephalopathy/Dementia with behavioral disturbance  - haldol help with agitation. Will order PRN haldol.  - Continued donepezil once able  BPH -  Continue Proscar once able  Transaminases; follow trend.    DVT prophylaxis: SCD Code Status: Full code.  Family Communication: Daughter at bedside. Daughter would like to take patient home  Disposition Plan: home in 24 hours.   Consultants:   CCM  Procedures:   ECHO ; normal ef  Antimicrobials:  Zosyn 10-09. Vancomycin    Subjective: He is sleepy today.  denies chest pain.  He had hard night was uncomfortable. Had clot foley catheter   Objective: Vitals:   12/28/15 0500 12/28/15 0540 12/28/15 0800 12/28/15 1200  BP: (!) 144/85     Pulse: (!) 102     Resp: 20  20 20   Temp: 98.7 F (37.1 C)  99.8 F (37.7 C) 98.9 F (37.2 C)  TempSrc: Oral Oral Oral Oral  SpO2: 96%     Weight:      Height:        Intake/Output Summary (Last 24 hours) at 12/28/15 1440 Last data filed at 12/28/15 1000  Gross per 24 hour  Intake              620 ml  Output             1300 ml  Net             -680 ml   Filed Weights   12/24/15 1600  Weight: 125.2 kg (276 lb)    Examination:  General exam: Appears calm and comfortable  Respiratory system: Clear to auscultation. Respiratory effort normal. Cardiovascular system: S1 & S2 heard, RRR. No JVD, murmurs, rubs, gallops or clicks. No pedal edema. Gastrointestinal system: Abdomen is nondistended, soft and nontender. No organomegaly or masses felt. Normal bowel sounds heard. Central nervous system: Alert and oriented. Unable to perform due to AMS Extremities: Symmetric 5 x 5 power. Skin: No rashes, lesions or ulcers Psychiatry: now calm, was  agitated overnight     Data Reviewed: I have personally reviewed following labs and imaging studies  CBC:  Recent Labs Lab 12/24/15 1513 12/25/15 1034 12/26/15 0130 12/27/15 0239 12/28/15 0236  WBC 2.2* 14.9* 14.6* 13.3* 12.6*  NEUTROABS 1.8  --   --   --   --   HGB 15.1 11.7* 12.2* 12.2* 13.0  HCT 47.3 36.0* 36.7* 37.0* 39.2  MCV 94.0 91.4 90.0 89.6 89.3  PLT 147* 106* 129* 125* 172   Basic Metabolic Panel:  Recent Labs Lab 12/24/15 1513 12/25/15 1034 12/26/15 0130 12/27/15 0239 12/28/15 1035  NA 141 142 139 138  --   K 3.9 3.6 3.8 4.1  --   CL 105 110 107 106  --   CO2 19* 23 21* 24  --   GLUCOSE 129* 156* 138* 184*  --   BUN 16 19 18 12   --   CREATININE 1.23 1.15 0.98 0.81  --  CALCIUM 8.8* 7.8* 8.1* 8.0*  --   MG  --   --   --   --  1.9   GFR: Estimated Creatinine Clearance: 100.6 mL/min (by C-G formula based on SCr of 0.81 mg/dL). Liver Function Tests:  Recent Labs Lab 12/24/15 1513 12/25/15 1034  AST 49* 94*  ALT 26 61  ALKPHOS 56 37*  BILITOT 1.9* 1.4*  PROT 6.9 5.8*  ALBUMIN 4.4 3.2*   No results for input(s): LIPASE, AMYLASE in the last 168 hours.  Recent Labs Lab 12/25/15 0758  AMMONIA 18   Coagulation Profile:  Recent Labs Lab 12/25/15 0141  INR 1.44   Cardiac Enzymes:  Recent Labs Lab 12/25/15 0514 12/25/15 1034 12/25/15 1419 12/25/15 2149 12/26/15 0130  TROPONINI 1.24* 3.91* 5.15* 4.04* 4.18*   BNP (last 3 results) No results for input(s): PROBNP in the last 8760 hours. HbA1C: No results for input(s): HGBA1C in the last 72 hours. CBG:  Recent Labs Lab 12/24/15 1440  GLUCAP 115*   Lipid Profile: No results for input(s): CHOL, HDL, LDLCALC, TRIG, CHOLHDL, LDLDIRECT in the last 72 hours. Thyroid Function Tests: No results for input(s): TSH, T4TOTAL, FREET4, T3FREE, THYROIDAB in the last 72 hours. Anemia Panel: No results for input(s): VITAMINB12, FOLATE, FERRITIN, TIBC, IRON, RETICCTPCT in the last 72  hours. Sepsis Labs:  Recent Labs Lab 12/25/15 0756 12/25/15 1034 12/25/15 1419 12/25/15 1641  LATICACIDVEN 4.7* 3.3* 2.3* 2.3*    Recent Results (from the past 240 hour(s))  Culture, blood (Routine x 2)     Status: None (Preliminary result)   Collection Time: 12/24/15  3:20 PM  Result Value Ref Range Status   Specimen Description BLOOD RIGHT HAND  Final   Special Requests BOTTLES DRAWN AEROBIC AND ANAEROBIC 5CC  Final   Culture NO GROWTH 4 DAYS  Final   Report Status PENDING  Incomplete  Culture, blood (Routine x 2)     Status: Abnormal   Collection Time: 12/24/15  3:50 PM  Result Value Ref Range Status   Specimen Description BLOOD LEFT HAND  Final   Special Requests IN PEDIATRIC BOTTLE 2CC  Final   Culture  Setup Time   Final    GRAM NEGATIVE RODS IN PEDIATRIC BOTTLE Organism ID to follow CRITICAL RESULT CALLED TO, READ BACK BY AND VERIFIED WITH: MLoretha Brasil. Turner Pharm.D. 9:50 12/25/15 ([email or web address])    Culture MORGANELLA MORGANII (A)  Final   Report Status 12/27/2015 FINAL  Final   Organism ID, Bacteria MORGANELLA MORGANII  Final      Susceptibility   Morganella morganii - MIC*    AMPICILLIN >=32 RESISTANT Resistant     CEFAZOLIN >=64 RESISTANT Resistant     CEFEPIME <=1 SENSITIVE Sensitive     CEFTAZIDIME <=1 SENSITIVE Sensitive     CEFTRIAXONE <=1 SENSITIVE Sensitive     CIPROFLOXACIN <=0.25 SENSITIVE Sensitive     GENTAMICIN <=1 SENSITIVE Sensitive     IMIPENEM 2 SENSITIVE Sensitive     TRIMETH/SULFA <=20 SENSITIVE Sensitive     AMPICILLIN/SULBACTAM 16 INTERMEDIATE Intermediate     PIP/TAZO <=4 SENSITIVE Sensitive     * MORGANELLA MORGANII  Blood Culture ID Panel (Reflexed)     Status: None   Collection Time: 12/24/15  3:50 PM  Result Value Ref Range Status   Enterococcus species NOT DETECTED NOT DETECTED Final   Listeria monocytogenes NOT DETECTED NOT DETECTED Final   Staphylococcus species NOT DETECTED NOT DETECTED Final   Staphylococcus aureus NOT DETECTED NOT  DETECTED Final  Streptococcus species NOT DETECTED NOT DETECTED Final   Streptococcus agalactiae NOT DETECTED NOT DETECTED Final   Streptococcus pneumoniae NOT DETECTED NOT DETECTED Final   Streptococcus pyogenes NOT DETECTED NOT DETECTED Final   Acinetobacter baumannii NOT DETECTED NOT DETECTED Final   Enterobacteriaceae species NOT DETECTED NOT DETECTED Final   Enterobacter cloacae complex NOT DETECTED NOT DETECTED Final   Escherichia coli NOT DETECTED NOT DETECTED Final   Klebsiella oxytoca NOT DETECTED NOT DETECTED Final   Klebsiella pneumoniae NOT DETECTED NOT DETECTED Final   Proteus species NOT DETECTED NOT DETECTED Final   Serratia marcescens NOT DETECTED NOT DETECTED Final   Haemophilus influenzae NOT DETECTED NOT DETECTED Final   Neisseria meningitidis NOT DETECTED NOT DETECTED Final   Pseudomonas aeruginosa NOT DETECTED NOT DETECTED Final   Candida albicans NOT DETECTED NOT DETECTED Final   Candida glabrata NOT DETECTED NOT DETECTED Final   Candida krusei NOT DETECTED NOT DETECTED Final   Candida parapsilosis NOT DETECTED NOT DETECTED Final   Candida tropicalis NOT DETECTED NOT DETECTED Final  Urine culture     Status: Abnormal   Collection Time: 12/24/15  5:30 PM  Result Value Ref Range Status   Specimen Description URINE, RANDOM  Final   Special Requests NONE  Final   Culture 20,000 COLONIES/mL MORGANELLA MORGANII (A)  Final   Report Status 12/26/2015 FINAL  Final   Organism ID, Bacteria MORGANELLA MORGANII (A)  Final      Susceptibility   Morganella morganii - MIC*    AMPICILLIN >=32 RESISTANT Resistant     CEFAZOLIN >=64 RESISTANT Resistant     CEFTRIAXONE <=1 SENSITIVE Sensitive     CIPROFLOXACIN <=0.25 SENSITIVE Sensitive     GENTAMICIN <=1 SENSITIVE Sensitive     IMIPENEM 4 SENSITIVE Sensitive     NITROFURANTOIN 128 RESISTANT Resistant     TRIMETH/SULFA <=20 SENSITIVE Sensitive     AMPICILLIN/SULBACTAM 16 INTERMEDIATE Intermediate     PIP/TAZO <=4 SENSITIVE  Sensitive     * 20,000 COLONIES/mL MORGANELLA MORGANII  MRSA PCR Screening     Status: None   Collection Time: 12/25/15  4:10 AM  Result Value Ref Range Status   MRSA by PCR NEGATIVE NEGATIVE Final    Comment:        The GeneXpert MRSA Assay (FDA approved for NASAL specimens only), is one component of a comprehensive MRSA colonization surveillance program. It is not intended to diagnose MRSA infection nor to guide or monitor treatment for MRSA infections.   Culture, blood (routine x 2)     Status: None (Preliminary result)   Collection Time: 12/27/15  8:30 AM  Result Value Ref Range Status   Specimen Description BLOOD LEFT HAND  Final   Special Requests IN PEDIATRIC BOTTLE 1.5CC  Final   Culture NO GROWTH 1 DAY  Final   Report Status PENDING  Incomplete  Culture, blood (routine x 2)     Status: None (Preliminary result)   Collection Time: 12/27/15  8:45 AM  Result Value Ref Range Status   Specimen Description BLOOD LEFT HAND  Final   Special Requests IN PEDIATRIC BOTTLE 2CC  Final   Culture NO GROWTH 1 DAY  Final   Report Status PENDING  Incomplete         Radiology Studies: No results found.      Scheduled Meds: . cefTRIAXone (ROCEPHIN)  IV  1 g Intravenous Q24H  . donepezil  10 mg Oral Daily  . dorzolamide  1 drop Both Eyes Daily  .  feeding supplement (ENSURE ENLIVE)  237 mL Oral BID BM  . finasteride  5 mg Oral Daily  . furosemide  40 mg Oral Daily  . metoprolol succinate  50 mg Oral Daily  . multivitamin with minerals  1 tablet Oral Daily  . polyethylene glycol  17 g Oral Daily  . potassium chloride  10 mEq Oral Daily  . simvastatin  20 mg Oral QPM  . timolol  1 drop Both Eyes BID   Continuous Infusions:     LOS: 4 days    Time spent: 35 minutes.     Alba Cory, MD [facility] Pager [phone]  If 7PM-7AM, please contact night-coverage [email or web address] Password [email or web address] 12/28/2015, 2:40 PM

## 2015-12-28 NOTE — Progress Notes (Signed)
Physical Therapy Treatment Patient Details Name: Daryl GaultDominic M Trinidad MRN: [id number] DOB: 01/22/1934 Today's Date: 12/28/2015    History of Present Illness Mr. Daryl Salas is a 82 year old with past medical history of hypertension, hyperlipidemia, dementia. He was admitted yesterday with bleeding from the tip of the penis. He was noted to be hypotensive in urgent care and [facility]. When he was found to have leukopenia, elevated lactic acid, hypertension, borderline urinalysis. He had a CT of the abdomen pelvis which showed possible pyelonephritis.     PT Comments    Pt admitted with above diagnosis. Pt currently with functional limitations due to balance and endurance deficits. Pt needed max assist of 2 to stand.  Very difficult to stand pt.  Daughter present and wants to take pt home.  Discussed that pt will be difficult at home and that they will need pressure relieving cushion, hospital bed, hoyer lift and 20 x20 wheelchair with footrests, desk arms and anti tippers.  Will need HHPT,HHOT and HHAide as well If family really wants to take pt home.  Pt will benefit from skilled PT to increase their independence and safety with mobility to allow discharge to the venue listed below.    Follow Up Recommendations  SNF     Equipment Recommendations  Hospital bed;Wheelchair (measurements PT);Wheelchair cushion (measurements PT);Other (comment) (wc measurements above, pressure relieving cushion, hoyer lift)    Recommendations for Other Services OT consult     Precautions / Restrictions Precautions Precautions: Fall Precaution Comments: monitor hr Restrictions Weight Bearing Restrictions: No    Mobility  Bed Mobility Overal bed mobility: Needs Assistance;+2 for physical assistance Bed Mobility: Rolling;Supine to Sit;Sit to Supine Rolling: +2 for physical assistance;Total assist   Supine to sit: Max assist;+2 for physical assistance Sit to supine: Max assist;+2 for physical  assistance   General bed mobility comments: assist for legs and trunk. Pt initiated movement with extra time.  Utilized pad to assist with scooting forward  Transfers Overall transfer level: Needs assistance Equipment used: Rolling walker (2 wheeled);Ambulation equipment used (and sara stedy)   Sit to Stand: Max assist;+2 physical assistance         General transfer comment: pt initiated movement; cues for UE/LE placement and assist to rise, shift hips anteriorly and stabilize.  Pt stood but had a lot of difficulty standing fully upright.  Pt needed cues and max assist to maintain standing and could not move his feet for pivot transfer.  Had pt sit back down and got Stedy to use to get pt to chair.  Pt still with difficulty standing fully upright with Stedy but was able to get the buttock pads behind him with max assist +2 given to stand.    Ambulation/Gait             General Gait Details: unable   Stairs            Wheelchair Mobility    Modified Rankin (Stroke Patients Only)       Balance Overall balance assessment: Needs assistance Sitting-balance support: Bilateral upper extremity supported;Feet supported Sitting balance-Leahy Scale: Poor Sitting balance - Comments: min to mod A to maintain balance. Short spurts of min guard Postural control: Posterior lean Standing balance support: Bilateral upper extremity supported;During functional activity Standing balance-Leahy Scale: Zero Standing balance comment: Could not stand with RW or Stedy without max assist.                     Cognition  Arousal/Alertness: Awake/alert Behavior During Therapy: WFL for tasks assessed/performed Overall Cognitive Status:  (followed all commands, sometimes multiple cues) Area of Impairment: Orientation;Following commands;Problem solving Orientation Level: Disoriented to;Place;Time;Situation   Memory: Decreased short-term memory Following Commands: Follows one step  commands with increased time     Problem Solving: Slow processing;Decreased initiation;Difficulty sequencing;Requires verbal cues;Requires tactile cues General Comments: pt has dementia at baseline; daughter present and he responded well to basic commands    Exercises General Exercises - Lower Extremity Ankle Circles/Pumps: AROM;Both;10 reps;Seated Long Arc Quad: AROM;Both;10 reps;Seated    General Comments General comments (skin integrity, edema, etc.): Daughter present and wants to take pt home. Discussed that he will bedifficult to handle at home and they will need lift and hospital bed.       Pertinent Vitals/Pain Pain Assessment: Faces Faces Pain Scale: Hurts even more Pain Location: LLE Pain Descriptors / Indicators: Aching Pain Intervention(s): Limited activity within patient's tolerance;Monitored during session;Repositioned  HR 105-141 bpm with activity, other VSS  Home Living Family/patient expects to be discharged to:: Private residence Living Arrangements: Spouse/significant other Available Help at Discharge: Family;Available 24 hours/day         Home Equipment: Bedside commode;Shower seat;Walker - 2 wheels      Prior Function Level of Independence: Needs assistance    ADL's / Homemaking Assistance Needed: wife helped with bathing and dressing using shower chair     PT Goals (current goals can now be found in the care plan section) Acute Rehab PT Goals Patient Stated Goal: home Progress towards PT goals: Progressing toward goals    Frequency    Min 2X/week      PT Plan Current plan remains appropriate    Co-evaluation PT/OT/SLP Co-Evaluation/Treatment: Yes Reason for Co-Treatment: Complexity of the patient's impairments (multi-system involvement) PT goals addressed during session: Mobility/safety with mobility OT goals addressed during session: ADL's and self-care     End of Session Equipment Utilized During Treatment: Gait belt Activity  Tolerance: Patient limited by lethargy;Patient limited by fatigue;Patient limited by pain Patient left: with call bell/phone within reach;in chair;with chair alarm set;with family/visitor present     Time: 0920-1002 PT Time Calculation (min) (ACUTE ONLY): 42 min  Charges:  $Therapeutic Activity: 23-37 mins                    G CodesBerline Lopes January 03, 2016, 1:37 PM [employer] Acute [facility] [phone] (pager)

## 2015-12-28 NOTE — Care Management Important Message (Signed)
Important Message  Patient Details  Name: Daryl Salas MRN: [id number] Date of Birth: 08/19/1933   Medicare Important Message Given:  Yes    Luisana Lutzke Abena 12/28/2015, 10:18 AM

## 2015-12-29 LAB — CBC
HCT: 38.7 % — ABNORMAL LOW (ref 39.0–52.0)
Hemoglobin: 12.7 g/dL — ABNORMAL LOW (ref 13.0–17.0)
MCH: 29.5 pg (ref 26.0–34.0)
MCHC: 32.8 g/dL (ref 30.0–36.0)
MCV: 90 fL (ref 78.0–100.0)
PLATELETS: 184 10*3/uL (ref 150–400)
RBC: 4.3 MIL/uL (ref 4.22–5.81)
RDW: 13.4 % (ref 11.5–15.5)
WBC: 10 10*3/uL (ref 4.0–10.5)

## 2015-12-29 LAB — CULTURE, BLOOD (ROUTINE X 2): Culture: NO GROWTH

## 2015-12-29 MED ORDER — CLOPIDOGREL BISULFATE 75 MG PO TABS: 75.0000 mg | ORAL_TABLET | Freq: Every day | ORAL | 0 refills | Status: AC

## 2015-12-29 MED ORDER — SULFAMETHOXAZOLE-TRIMETHOPRIM 800-160 MG PO TABS: 1.0000 | ORAL_TABLET | Freq: Two times a day (BID) | ORAL | 0 refills | Status: AC

## 2015-12-29 MED ORDER — METOPROLOL SUCCINATE ER 50 MG PO TB24: 50.0000 mg | ORAL_TABLET | Freq: Every day | ORAL | 0 refills | Status: AC

## 2015-12-29 MED ORDER — ENSURE ENLIVE PO LIQD: 237.0000 mL | Freq: Two times a day (BID) | ORAL | 12 refills | Status: AC

## 2015-12-29 MED ORDER — ACETAMINOPHEN 325 MG PO TABS: 650.0000 mg | ORAL_TABLET | Freq: Four times a day (QID) | ORAL | 0 refills | Status: AC | PRN

## 2015-12-29 NOTE — Clinical Social Work Note (Signed)
Per RN, patient needs PTAR to home. [facility].  CSW signing off. Consult again if any social work needs arise.  Charlynn CourtSarah Evaluna Utke, CSW [phone]

## 2015-12-29 NOTE — Plan of Care (Signed)
Problem: Education: Goal: Knowledge of [facility] Education information/materials will improve Outcome: Progressing Pt and family aware of [facility] procedures and policies. Pt family very understanding and ask questions to help understand and deal with pt medical condition

## 2015-12-29 NOTE — Progress Notes (Signed)
Case management made aware of DME and PTAR needs

## 2015-12-29 NOTE — Discharge Summary (Signed)
Physician Discharge Summary  Daryl Salas VFI:[id number] DOB: 10-Mar-1934 DOA: 12/24/2015  PCP: Mickie Hillier, MD  Admit date: 12/24/2015 Discharge date: 12/29/2015  Admitted From: Home  Disposition:  Home   Recommendations for Outpatient Follow-up:  1. Follow up with PCP in 1-2 weeks 2. Please obtain BMP/CBC in one week 3. Please follow up on the following pending results: final blood culture.   Home Health; yes, PT, RN Equipment/Devices: Wheelchair, ..   Discharge Condition: stable.  CODE STATUS: Full code.  Diet recommendation: Heart Healthy    Brief/Interim Summary:  Daryl Salas a 82 y.o.malewith medical history significant of HTN, CHF, HLD, BPH; who presents with complaints of bleeding from penisand altered mental status. History is obtained from the wife and daughter as the patient is acutely confused more than usual and unable to provide his own. Wife states that she saw the patient was bleeding from the tip his penis is a well urinating. Patient denied scratching his penis or having any other trauma to her knowledge. They took him to the urgent care while and while there the patient started yawning. It was unusual as the patient continued to do this and became pale and diaphoretic. The daughter had nursing care at urgent care facility check his blood pressure and states that his systolic BPs were in 70s. Over the last few days the family notes that the patient had had decreased by mouth intake and question if he was dehydrated. Family notes that the patient has been followed by Dr Viann Fish of cardiology and had previous echocardiograms in the past. They are not aware of his current heart function.  ED Course:Upon admission into the emergency department patient was seen to be hypothermic temperature of 96.46F then febrile up to 100.8, heart rates 109-125, respirations 14-31, blood pressure maintained 102/57/170 7/69, O2 saturations 18 on 3 L of nasal  cannula oxygen. Lab work revealed WBC 2.2, hemoglobin 15.1, platelets 147, sodium 141, potassium 3.9, chloride 105, CO2 19, BUN 16, creatinine 1.22, lactic acid 8.77. Patient was given 3 L of IV fluids in the ED with vancomycin and Zosyn per sepsis protocol. However, patient required nasal cannula oxygen to maintain O2 saturations. In out catheter revealed initially for bloody then turn pinkish urine. TRH called to admit to stepdown.  Assessment & Plan: 1-Sepsis, source could be UTI, CT finding consistent with Pyelonephritis and possible prostatitis. Gram Negative Bacteremia;  Presents with Lactic acid at 8--trending down to 2.3 He has received 3 L IV fluids.  Continue with IV Antibiotics, Change Zosyn to ceftriaxone.  blood culture ; Growing Morganella sensitive to zosyn, ceftriaxone Appreciate CCM assistance. BP remain stable.  Day 5 antibiotics. plan to discharge on Bactrim for 9 more days to complete 2 weeks tx.  Repeated blood culture no growth to date.    2-Acute Respiratory Failure: At baseline patient not on oxygen at home patient required at least 3 L nasal cannula oxygen to maintain O2 sats following IV fluid hydration for sepsis protocol. - continue with oxygen supplementation.  -PRN Nebulize treatments.  -Continue home dose of lasix.   3-N-STEMI, Elevation of troponin;  Denies chest pain. Troponin Peak to 5--trending down 4.  ECHO normal EF, no wall motion abnormalities, diastolic dysfunction grade 1.  Stop heparin/ start  plavix when urine is clear. Marland Kitchen  Resume metoprolol, on 10-11. Increased dose 10-13 due to elevated HR plan to discharge on plavix.   4-Hematuria: Acute. Related to cystitis vs prostatitis.  - Place three-way Foley  catheter with coudetip for BPH - Monitor bleeding from meatus -Appreciate Urology evaluation.  -irrigation PRN.  Foley discontinue. Has been able to urinate  Possible atrial fibrillation:Acute.no previous history although cannot be  completely sure given a lot of artifact on EKG.  -continue to monitor -TSH normal.  - repeat EKG. -resume metoprolol  Essentialhypertension:  resume benazepril and metropolol   Acute diastolic Congestive heart failure: exacerbation.  Patient was initially given 3 L of IV fluids for sepsis. Patient requiring nasal cannula oxygen of 3 L to maintain O2 saturations with some tachypnea. Chest x-ray showing cardiomegaly with signs of central congestion possibly after first liter of fluids. - Strict In and Outs Resume oral lasix.  negative 3 L.   Acute encephalopathy/Dementia with behavioral disturbance  - haldol help with agitation. Will order PRN haldol.  - Continued donepezil once able -stable.   BPH - Continue Proscar   Transaminases; follow trend.   Discharge Diagnoses:  Principal Problem:   Sepsis (HCC) Active Problems:   Acute encephalopathy   Hematuria   Acute pyelonephritis   Elevated troponin I level   Acute respiratory failure with hypoxia (HCC)   Chronic diastolic heart failure (HCC)   Dementia   Demand ischemia [facility])    Discharge Instructions  Discharge Instructions    Diet - low sodium heart healthy    Complete by:  As directed    Increase activity slowly    Complete by:  As directed        Medication List    TAKE these medications   acetaminophen 325 MG tablet Commonly known as:  TYLENOL Take 2 tablets (650 mg total) by mouth every 6 (six) hours as needed for mild pain (or Fever >/= 101).   aspirin 81 MG tablet Take 81 mg by mouth daily.   benazepril 40 MG tablet Commonly known as:  LOTENSIN Take 40 mg by mouth daily.   cholecalciferol 1000 units tablet Commonly known as:  VITAMIN D Take 2,000 Units by mouth daily.   Co Q 10 100 MG Caps Take by mouth daily.   donepezil 10 MG tablet Commonly known as:  ARICEPT Take 10 mg by mouth daily.   dorzolamide 2 % ophthalmic solution Commonly known as:  TRUSOPT Place 1 drop into both  eyes daily.   feeding supplement (ENSURE ENLIVE) Liqd Take 237 mLs by mouth 2 (two) times daily between meals.   finasteride 5 MG tablet Commonly known as:  PROSCAR Take 5 mg by mouth daily.   fish oil-omega-3 fatty acids 1000 MG capsule Take 2 g by mouth daily.   furosemide 20 MG tablet Commonly known as:  LASIX Take 60 mg by mouth daily.   metoprolol succinate 50 MG 24 hr tablet Commonly known as:  TOPROL-XL Take 1 tablet (50 mg total) by mouth daily. What changed:  medication strength  how much to take   multivitamin with minerals tablet Take 1 tablet by mouth daily.   potassium chloride 10 MEQ tablet Commonly known as:  K-DUR Take 10 mEq by mouth daily.   simvastatin 20 MG tablet Commonly known as:  ZOCOR Take 20 mg by mouth every evening.   sulfamethoxazole-trimethoprim 800-160 MG tablet Commonly known as:  BACTRIM DS,SEPTRA DS Take 1 tablet by mouth 2 (two) times daily.   timolol 0.5 % ophthalmic solution Commonly known as:  BETIMOL Place 1 drop into both eyes 2 (two) times daily.   vitamin E 400 UNIT capsule Take 400 Units by mouth daily.  No Known Allergies  Consultations:  Cardiology  Urology   ccm   Procedures/Studies: Ct Abdomen Pelvis Wo Contrast  Result Date: 12/24/2015 CLINICAL DATA:  Acute onset of sepsis.  Initial encounter. EXAM: CT CHEST, ABDOMEN AND PELVIS WITHOUT CONTRAST TECHNIQUE: Multidetector CT imaging of the chest, abdomen and pelvis was performed following the standard protocol without IV contrast. COMPARISON:  None. FINDINGS: CT CHEST FINDINGS Cardiovascular: Scattered coronary artery calcifications are seen. Calcification is noted at the aortic valve. Mild calcification is noted at the great vessels. The thoracic aorta is grossly unremarkable in appearance. The heart is normal in size. Mediastinum/Nodes: The mediastinum is otherwise grossly unremarkable. No mediastinal lymphadenopathy is seen. No pericardial effusion is  identified. The visualized portions of the thyroid gland are unremarkable. No axillary lymphadenopathy is seen. Lungs/Pleura: Trace bilateral pleural fluid is noted, with patchy bibasilar opacities likely reflecting atelectasis. No pneumothorax is identified. No dominant mass is seen. Evaluation is suboptimal due to motion artifact. Musculoskeletal: No acute osseous abnormalities are identified. The chest wall is grossly unremarkable in appearance. CT ABDOMEN PELVIS FINDINGS Hepatobiliary: The liver is unremarkable in appearance. The gallbladder is unremarkable in appearance. The common bile duct remains normal in caliber. Pancreas: The pancreas is within normal limits. Spleen: The spleen is unremarkable in appearance. Adrenals/Urinary Tract: The adrenal glands are unremarkable in appearance. Nonspecific perinephric stranding is noted bilaterally. Mild bilateral renal atrophy is noted. There is no evidence of hydronephrosis. No renal or ureteral stones are seen. Stranding about the left renal pelvis could reflect mild left-sided pyelonephritis. Stomach/Bowel: The stomach is unremarkable in appearance. The small bowel is within normal limits. The appendix is not visualized; there is no evidence for appendicitis. Minimal diverticulosis is noted along the proximal sigmoid colon, without evidence of diverticulitis. Vascular/Lymphatic: Scattered calcification is seen along the abdominal aorta and its branches. The abdominal aorta is otherwise grossly unremarkable. The inferior vena cava is grossly unremarkable. Visualized retroperitoneal nodes remain borderline normal in size. No pelvic sidewall lymphadenopathy is identified. Reproductive: The prostate is enlarged, measuring 5.4 cm in transverse dimension, with impression on the base of the bladder. The bladder is mildly distended and grossly unremarkable. Other: No additional soft tissue abnormalities are seen. Musculoskeletal: No acute osseous abnormalities are  identified. The visualized musculature is unremarkable in appearance. IMPRESSION: 1. Stranding about the left renal pelvis could reflect mild left-sided pyelonephritis. Would correlate with the patient's symptoms. 2. Trace bilateral pleural fluid, with patchy bibasilar airspace opacities likely reflecting atelectasis. Evaluation of the lung fields is suboptimal due to motion artifact. 3. Scattered coronary artery calcifications seen. Calcification noted at the aortic valve. 4. Mild bilateral renal atrophy noted. 5. Minimal diverticulosis along the proximal sigmoid colon, without evidence of diverticulitis. 6. Scattered aortic atherosclerosis noted. 7. Enlarged prostate noted, with impression on the base of the bladder. Would correlate with PSA. Electronically Signed   By: Roanna Raider M.D.   On: 12/24/2015 20:50   Ct Chest Wo Contrast  Result Date: 12/24/2015 CLINICAL DATA:  Acute onset of sepsis.  Initial encounter. EXAM: CT CHEST, ABDOMEN AND PELVIS WITHOUT CONTRAST TECHNIQUE: Multidetector CT imaging of the chest, abdomen and pelvis was performed following the standard protocol without IV contrast. COMPARISON:  None. FINDINGS: CT CHEST FINDINGS Cardiovascular: Scattered coronary artery calcifications are seen. Calcification is noted at the aortic valve. Mild calcification is noted at the great vessels. The thoracic aorta is grossly unremarkable in appearance. The heart is normal in size. Mediastinum/Nodes: The mediastinum is otherwise grossly unremarkable. No mediastinal  lymphadenopathy is seen. No pericardial effusion is identified. The visualized portions of the thyroid gland are unremarkable. No axillary lymphadenopathy is seen. Lungs/Pleura: Trace bilateral pleural fluid is noted, with patchy bibasilar opacities likely reflecting atelectasis. No pneumothorax is identified. No dominant mass is seen. Evaluation is suboptimal due to motion artifact. Musculoskeletal: No acute osseous abnormalities are  identified. The chest wall is grossly unremarkable in appearance. CT ABDOMEN PELVIS FINDINGS Hepatobiliary: The liver is unremarkable in appearance. The gallbladder is unremarkable in appearance. The common bile duct remains normal in caliber. Pancreas: The pancreas is within normal limits. Spleen: The spleen is unremarkable in appearance. Adrenals/Urinary Tract: The adrenal glands are unremarkable in appearance. Nonspecific perinephric stranding is noted bilaterally. Mild bilateral renal atrophy is noted. There is no evidence of hydronephrosis. No renal or ureteral stones are seen. Stranding about the left renal pelvis could reflect mild left-sided pyelonephritis. Stomach/Bowel: The stomach is unremarkable in appearance. The small bowel is within normal limits. The appendix is not visualized; there is no evidence for appendicitis. Minimal diverticulosis is noted along the proximal sigmoid colon, without evidence of diverticulitis. Vascular/Lymphatic: Scattered calcification is seen along the abdominal aorta and its branches. The abdominal aorta is otherwise grossly unremarkable. The inferior vena cava is grossly unremarkable. Visualized retroperitoneal nodes remain borderline normal in size. No pelvic sidewall lymphadenopathy is identified. Reproductive: The prostate is enlarged, measuring 5.4 cm in transverse dimension, with impression on the base of the bladder. The bladder is mildly distended and grossly unremarkable. Other: No additional soft tissue abnormalities are seen. Musculoskeletal: No acute osseous abnormalities are identified. The visualized musculature is unremarkable in appearance. IMPRESSION: 1. Stranding about the left renal pelvis could reflect mild left-sided pyelonephritis. Would correlate with the patient's symptoms. 2. Trace bilateral pleural fluid, with patchy bibasilar airspace opacities likely reflecting atelectasis. Evaluation of the lung fields is suboptimal due to motion artifact. 3.  Scattered coronary artery calcifications seen. Calcification noted at the aortic valve. 4. Mild bilateral renal atrophy noted. 5. Minimal diverticulosis along the proximal sigmoid colon, without evidence of diverticulitis. 6. Scattered aortic atherosclerosis noted. 7. Enlarged prostate noted, with impression on the base of the bladder. Would correlate with PSA. Electronically Signed   By: Roanna Raider M.D.   On: 12/24/2015 20:50   Dg Chest Port 1 View  Result Date: 12/24/2015 CLINICAL DATA:  Sepsis and hypoxia EXAM: PORTABLE CHEST 1 VIEW COMPARISON:  03/12/2013 FINDINGS: Mild cardiac enlargement. Low lung volumes. Pulmonary vascular congestion noted. No pleural effusions or edema. No airspace consolidation. IMPRESSION: 1. Mild cardiac enlargement and pulmonary vascular congestion. Electronically Signed   By: Signa Kell M.D.   On: 12/24/2015 18:13     Subjective: Feeling ok, ne dyspnea.    Discharge Exam: Vitals:   12/29/15 0800 12/29/15 0900  BP: (!) 154/73 (!) 149/79  Pulse: (!) 102 100  Resp: (!) 24 (!) 21  Temp: 98.1 F (36.7 C)    Vitals:   12/29/15 0505 12/29/15 0700 12/29/15 0800 12/29/15 0900  BP: (!) 147/80 (!) 144/77 (!) 154/73 (!) 149/79  Pulse: 99 100 (!) 102 100  Resp: 20 (!) 22 (!) 24 (!) 21  Temp: 98.1 F (36.7 C)  98.1 F (36.7 C)   TempSrc: Oral  Oral   SpO2: 96% 94% 96% 92%  Weight:      Height:        General: Pt is alert, awake, not in acute distress Cardiovascular: RRR, S1/S2 +, no rubs, no gallops Respiratory: CTA bilaterally, no wheezing,  no rhonchi Abdominal: Soft, NT, ND, bowel sounds + Extremities: no edema, no cyanosis    The results of significant diagnostics from this hospitalization (including imaging, microbiology, ancillary and laboratory) are listed below for reference.     Microbiology: Recent Results (from the past 240 hour(s))  Culture, blood (Routine x 2)     Status: None (Preliminary result)   Collection Time: 12/24/15  3:20  PM  Result Value Ref Range Status   Specimen Description BLOOD RIGHT HAND  Final   Special Requests BOTTLES DRAWN AEROBIC AND ANAEROBIC 5CC  Final   Culture NO GROWTH 4 DAYS  Final   Report Status PENDING  Incomplete  Culture, blood (Routine x 2)     Status: Abnormal   Collection Time: 12/24/15  3:50 PM  Result Value Ref Range Status   Specimen Description BLOOD LEFT HAND  Final   Special Requests IN PEDIATRIC BOTTLE 2CC  Final   Culture  Setup Time   Final    GRAM NEGATIVE RODS IN PEDIATRIC BOTTLE Organism ID to follow CRITICAL RESULT CALLED TO, READ BACK BY AND VERIFIED WITH: MLoretha Brasil.D. 9:50 12/25/15 ([email or web address])    Culture MORGANELLA MORGANII (A)  Final   Report Status 12/27/2015 FINAL  Final   Organism ID, Bacteria MORGANELLA MORGANII  Final      Susceptibility   Morganella morganii - MIC*    AMPICILLIN >=32 RESISTANT Resistant     CEFAZOLIN >=64 RESISTANT Resistant     CEFEPIME <=1 SENSITIVE Sensitive     CEFTAZIDIME <=1 SENSITIVE Sensitive     CEFTRIAXONE <=1 SENSITIVE Sensitive     CIPROFLOXACIN <=0.25 SENSITIVE Sensitive     GENTAMICIN <=1 SENSITIVE Sensitive     IMIPENEM 2 SENSITIVE Sensitive     TRIMETH/SULFA <=20 SENSITIVE Sensitive     AMPICILLIN/SULBACTAM 16 INTERMEDIATE Intermediate     PIP/TAZO <=4 SENSITIVE Sensitive     * MORGANELLA MORGANII  Blood Culture ID Panel (Reflexed)     Status: None   Collection Time: 12/24/15  3:50 PM  Result Value Ref Range Status   Enterococcus species NOT DETECTED NOT DETECTED Final   Listeria monocytogenes NOT DETECTED NOT DETECTED Final   Staphylococcus species NOT DETECTED NOT DETECTED Final   Staphylococcus aureus NOT DETECTED NOT DETECTED Final   Streptococcus species NOT DETECTED NOT DETECTED Final   Streptococcus agalactiae NOT DETECTED NOT DETECTED Final   Streptococcus pneumoniae NOT DETECTED NOT DETECTED Final   Streptococcus pyogenes NOT DETECTED NOT DETECTED Final   Acinetobacter baumannii NOT DETECTED NOT  DETECTED Final   Enterobacteriaceae species NOT DETECTED NOT DETECTED Final   Enterobacter cloacae complex NOT DETECTED NOT DETECTED Final   Escherichia coli NOT DETECTED NOT DETECTED Final   Klebsiella oxytoca NOT DETECTED NOT DETECTED Final   Klebsiella pneumoniae NOT DETECTED NOT DETECTED Final   Proteus species NOT DETECTED NOT DETECTED Final   Serratia marcescens NOT DETECTED NOT DETECTED Final   Haemophilus influenzae NOT DETECTED NOT DETECTED Final   Neisseria meningitidis NOT DETECTED NOT DETECTED Final   Pseudomonas aeruginosa NOT DETECTED NOT DETECTED Final   Candida albicans NOT DETECTED NOT DETECTED Final   Candida glabrata NOT DETECTED NOT DETECTED Final   Candida krusei NOT DETECTED NOT DETECTED Final   Candida parapsilosis NOT DETECTED NOT DETECTED Final   Candida tropicalis NOT DETECTED NOT DETECTED Final  Urine culture     Status: Abnormal   Collection Time: 12/24/15  5:30 PM  Result Value Ref Range Status   Specimen Description  URINE, RANDOM  Final   Special Requests NONE  Final   Culture 20,000 COLONIES/mL MORGANELLA MORGANII (A)  Final   Report Status 12/26/2015 FINAL  Final   Organism ID, Bacteria MORGANELLA MORGANII (A)  Final      Susceptibility   Morganella morganii - MIC*    AMPICILLIN >=32 RESISTANT Resistant     CEFAZOLIN >=64 RESISTANT Resistant     CEFTRIAXONE <=1 SENSITIVE Sensitive     CIPROFLOXACIN <=0.25 SENSITIVE Sensitive     GENTAMICIN <=1 SENSITIVE Sensitive     IMIPENEM 4 SENSITIVE Sensitive     NITROFURANTOIN 128 RESISTANT Resistant     TRIMETH/SULFA <=20 SENSITIVE Sensitive     AMPICILLIN/SULBACTAM 16 INTERMEDIATE Intermediate     PIP/TAZO <=4 SENSITIVE Sensitive     * 20,000 COLONIES/mL MORGANELLA MORGANII  MRSA PCR Screening     Status: None   Collection Time: 12/25/15  4:10 AM  Result Value Ref Range Status   MRSA by PCR NEGATIVE NEGATIVE Final    Comment:        The GeneXpert MRSA Assay (FDA approved for NASAL specimens only), is  one component of a comprehensive MRSA colonization surveillance program. It is not intended to diagnose MRSA infection nor to guide or monitor treatment for MRSA infections.   Culture, blood (routine x 2)     Status: None (Preliminary result)   Collection Time: 12/27/15  8:30 AM  Result Value Ref Range Status   Specimen Description BLOOD LEFT HAND  Final   Special Requests IN PEDIATRIC BOTTLE 1.5CC  Final   Culture NO GROWTH 1 DAY  Final   Report Status PENDING  Incomplete  Culture, blood (routine x 2)     Status: None (Preliminary result)   Collection Time: 12/27/15  8:45 AM  Result Value Ref Range Status   Specimen Description BLOOD LEFT HAND  Final   Special Requests IN PEDIATRIC BOTTLE 2CC  Final   Culture NO GROWTH 1 DAY  Final   Report Status PENDING  Incomplete     Labs: BNP (last 3 results)  Recent Labs  12/25/15 0141  BNP 474.0*   Basic Metabolic Panel:  Recent Labs Lab 12/24/15 1513 12/25/15 1034 12/26/15 0130 12/27/15 0239 12/28/15 1015 12/28/15 1035  NA 141 142 139 138 142  --   K 3.9 3.6 3.8 4.1 3.7  --   CL 105 110 107 106 107  --   CO2 19* 23 21* 24 23  --   GLUCOSE 129* 156* 138* 184* 270*  --   BUN 16 19 18 12 14   --   CREATININE 1.23 1.15 0.98 0.81 0.92  --   CALCIUM 8.8* 7.8* 8.1* 8.0* 8.7*  --   MG  --   --   --   --   --  1.9   Liver Function Tests:  Recent Labs Lab 12/24/15 1513 12/25/15 1034  AST 49* 94*  ALT 26 61  ALKPHOS 56 37*  BILITOT 1.9* 1.4*  PROT 6.9 5.8*  ALBUMIN 4.4 3.2*   No results for input(s): LIPASE, AMYLASE in the last 168 hours.  Recent Labs Lab 12/25/15 0758  AMMONIA 18   CBC:  Recent Labs Lab 12/24/15 1513 12/25/15 1034 12/26/15 0130 12/27/15 0239 12/28/15 0236 12/29/15 0809  WBC 2.2* 14.9* 14.6* 13.3* 12.6* 10.0  NEUTROABS 1.8  --   --   --   --   --   HGB 15.1 11.7* 12.2* 12.2* 13.0 12.7*  HCT 47.3 36.0*  36.7* 37.0* 39.2 38.7*  MCV 94.0 91.4 90.0 89.6 89.3 90.0  PLT 147* 106* 129* 125*  172 184   Cardiac Enzymes:  Recent Labs Lab 12/25/15 0514 12/25/15 1034 12/25/15 1419 12/25/15 2149 12/26/15 0130  TROPONINI 1.24* 3.91* 5.15* 4.04* 4.18*   BNP: Invalid input(s): POCBNP CBG:  Recent Labs Lab 12/24/15 1440  GLUCAP 115*   D-Dimer No results for input(s): DDIMER in the last 72 hours. Hgb A1c No results for input(s): HGBA1C in the last 72 hours. Lipid Profile No results for input(s): CHOL, HDL, LDLCALC, TRIG, CHOLHDL, LDLDIRECT in the last 72 hours. Thyroid function studies No results for input(s): TSH, T4TOTAL, T3FREE, THYROIDAB in the last 72 hours.  Invalid input(s): FREET3 Anemia work up No results for input(s): VITAMINB12, FOLATE, FERRITIN, TIBC, IRON, RETICCTPCT in the last 72 hours. Urinalysis    Component Value Date/Time   COLORURINE AMBER (A) 12/24/2015 1730   APPEARANCEUR CLOUDY (A) 12/24/2015 1730   LABSPEC 1.012 12/24/2015 1730   PHURINE 6.0 12/24/2015 1730   GLUCOSEU NEGATIVE 12/24/2015 1730   HGBUR LARGE (A) 12/24/2015 1730   BILIRUBINUR NEGATIVE 12/24/2015 1730   KETONESUR NEGATIVE 12/24/2015 1730   PROTEINUR 30 (A) 12/24/2015 1730   UROBILINOGEN 1.0 07/20/2009 0254   NITRITE NEGATIVE 12/24/2015 1730   LEUKOCYTESUR SMALL (A) 12/24/2015 1730   Sepsis Labs Invalid input(s): PROCALCITONIN,  WBC,  LACTICIDVEN Microbiology Recent Results (from the past 240 hour(s))  Culture, blood (Routine x 2)     Status: None (Preliminary result)   Collection Time: 12/24/15  3:20 PM  Result Value Ref Range Status   Specimen Description BLOOD RIGHT HAND  Final   Special Requests BOTTLES DRAWN AEROBIC AND ANAEROBIC 5CC  Final   Culture NO GROWTH 4 DAYS  Final   Report Status PENDING  Incomplete  Culture, blood (Routine x 2)     Status: Abnormal   Collection Time: 12/24/15  3:50 PM  Result Value Ref Range Status   Specimen Description BLOOD LEFT HAND  Final   Special Requests IN PEDIATRIC BOTTLE 2CC  Final   Culture  Setup Time   Final    GRAM  NEGATIVE RODS IN PEDIATRIC BOTTLE Organism ID to follow CRITICAL RESULT CALLED TO, READ BACK BY AND VERIFIED WITH: MMayford Knife. Turner Pharm.D. 9:50 12/25/15 ([email or web address])    Culture MORGANELLA MORGANII (A)  Final   Report Status 12/27/2015 FINAL  Final   Organism ID, Bacteria MORGANELLA MORGANII  Final      Susceptibility   Morganella morganii - MIC*    AMPICILLIN >=32 RESISTANT Resistant     CEFAZOLIN >=64 RESISTANT Resistant     CEFEPIME <=1 SENSITIVE Sensitive     CEFTAZIDIME <=1 SENSITIVE Sensitive     CEFTRIAXONE <=1 SENSITIVE Sensitive     CIPROFLOXACIN <=0.25 SENSITIVE Sensitive     GENTAMICIN <=1 SENSITIVE Sensitive     IMIPENEM 2 SENSITIVE Sensitive     TRIMETH/SULFA <=20 SENSITIVE Sensitive     AMPICILLIN/SULBACTAM 16 INTERMEDIATE Intermediate     PIP/TAZO <=4 SENSITIVE Sensitive     * MORGANELLA MORGANII  Blood Culture ID Panel (Reflexed)     Status: None   Collection Time: 12/24/15  3:50 PM  Result Value Ref Range Status   Enterococcus species NOT DETECTED NOT DETECTED Final   Listeria monocytogenes NOT DETECTED NOT DETECTED Final   Staphylococcus species NOT DETECTED NOT DETECTED Final   Staphylococcus aureus NOT DETECTED NOT DETECTED Final   Streptococcus species NOT DETECTED NOT DETECTED Final   Streptococcus  agalactiae NOT DETECTED NOT DETECTED Final   Streptococcus pneumoniae NOT DETECTED NOT DETECTED Final   Streptococcus pyogenes NOT DETECTED NOT DETECTED Final   Acinetobacter baumannii NOT DETECTED NOT DETECTED Final   Enterobacteriaceae species NOT DETECTED NOT DETECTED Final   Enterobacter cloacae complex NOT DETECTED NOT DETECTED Final   Escherichia coli NOT DETECTED NOT DETECTED Final   Klebsiella oxytoca NOT DETECTED NOT DETECTED Final   Klebsiella pneumoniae NOT DETECTED NOT DETECTED Final   Proteus species NOT DETECTED NOT DETECTED Final   Serratia marcescens NOT DETECTED NOT DETECTED Final   Haemophilus influenzae NOT DETECTED NOT DETECTED Final   Neisseria  meningitidis NOT DETECTED NOT DETECTED Final   Pseudomonas aeruginosa NOT DETECTED NOT DETECTED Final   Candida albicans NOT DETECTED NOT DETECTED Final   Candida glabrata NOT DETECTED NOT DETECTED Final   Candida krusei NOT DETECTED NOT DETECTED Final   Candida parapsilosis NOT DETECTED NOT DETECTED Final   Candida tropicalis NOT DETECTED NOT DETECTED Final  Urine culture     Status: Abnormal   Collection Time: 12/24/15  5:30 PM  Result Value Ref Range Status   Specimen Description URINE, RANDOM  Final   Special Requests NONE  Final   Culture 20,000 COLONIES/mL MORGANELLA MORGANII (A)  Final   Report Status 12/26/2015 FINAL  Final   Organism ID, Bacteria MORGANELLA MORGANII (A)  Final      Susceptibility   Morganella morganii - MIC*    AMPICILLIN >=32 RESISTANT Resistant     CEFAZOLIN >=64 RESISTANT Resistant     CEFTRIAXONE <=1 SENSITIVE Sensitive     CIPROFLOXACIN <=0.25 SENSITIVE Sensitive     GENTAMICIN <=1 SENSITIVE Sensitive     IMIPENEM 4 SENSITIVE Sensitive     NITROFURANTOIN 128 RESISTANT Resistant     TRIMETH/SULFA <=20 SENSITIVE Sensitive     AMPICILLIN/SULBACTAM 16 INTERMEDIATE Intermediate     PIP/TAZO <=4 SENSITIVE Sensitive     * 20,000 COLONIES/mL MORGANELLA MORGANII  MRSA PCR Screening     Status: None   Collection Time: 12/25/15  4:10 AM  Result Value Ref Range Status   MRSA by PCR NEGATIVE NEGATIVE Final    Comment:        The GeneXpert MRSA Assay (FDA approved for NASAL specimens only), is one component of a comprehensive MRSA colonization surveillance program. It is not intended to diagnose MRSA infection nor to guide or monitor treatment for MRSA infections.   Culture, blood (routine x 2)     Status: None (Preliminary result)   Collection Time: 12/27/15  8:30 AM  Result Value Ref Range Status   Specimen Description BLOOD LEFT HAND  Final   Special Requests IN PEDIATRIC BOTTLE 1.5CC  Final   Culture NO GROWTH 1 DAY  Final   Report Status PENDING   Incomplete  Culture, blood (routine x 2)     Status: None (Preliminary result)   Collection Time: 12/27/15  8:45 AM  Result Value Ref Range Status   Specimen Description BLOOD LEFT HAND  Final   Special Requests IN PEDIATRIC BOTTLE 2CC  Final   Culture NO GROWTH 1 DAY  Final   Report Status PENDING  Incomplete     Time coordinating discharge: Over 30 minutes  SIGNED:   Alba Cory, MD  [facility] 12/29/2015, 9:32 AM Pager   If 7PM-7AM, please contact night-coverage [email or web address] Password [email or web address]

## 2016-01-01 LAB — CULTURE, BLOOD (ROUTINE X 2)
CULTURE: NO GROWTH
Culture: NO GROWTH

## 2016-02-15 DEATH — deceased

## 2018-02-10 IMAGING — CT CT ABD-PELV W/O CM
1 of 5 series · 10 of 46 positions shown, 13 images · non-contrast
Comparison: None.

CLINICAL DATA: Acute onset of sepsis.  Initial encounter.

EXAM:
CT CHEST, ABDOMEN AND PELVIS WITHOUT CONTRAST
TECHNIQUE: Multidetector CT imaging of the chest, abdomen and pelvis was
performed following the standard protocol without IV contrast.

[Series 207: lungs 2.0 · axial · 0.93mm/px · z∈[+523,+737]mm · 10 of 125 slices shown, 13 images]
[im 9/125  soft-tissue]
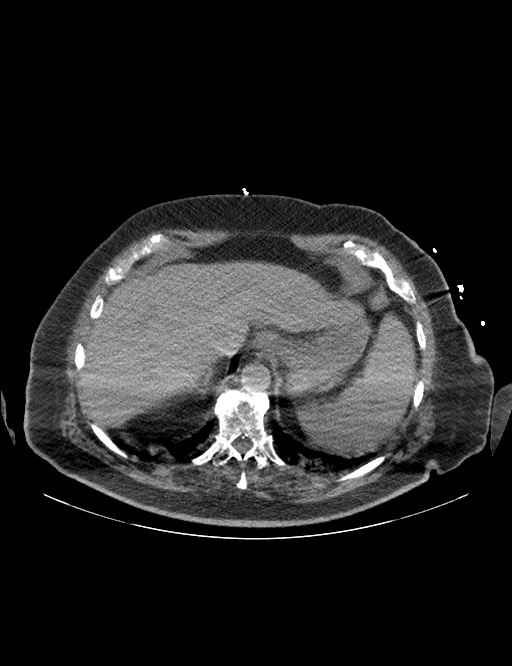
[im 9/125  bone]
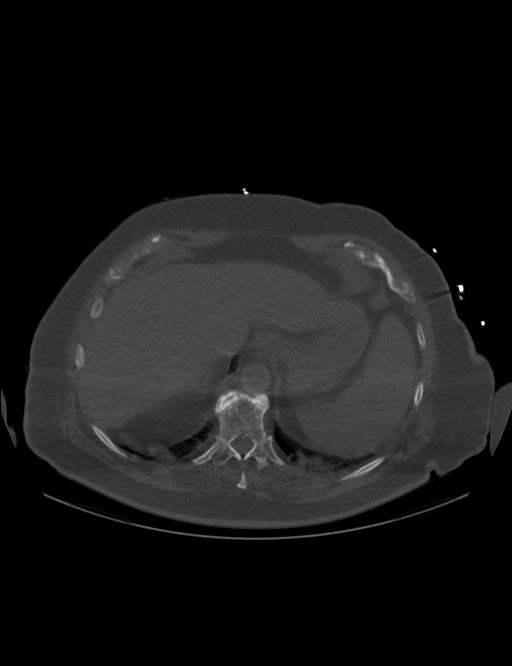
[im 25/125  soft-tissue]
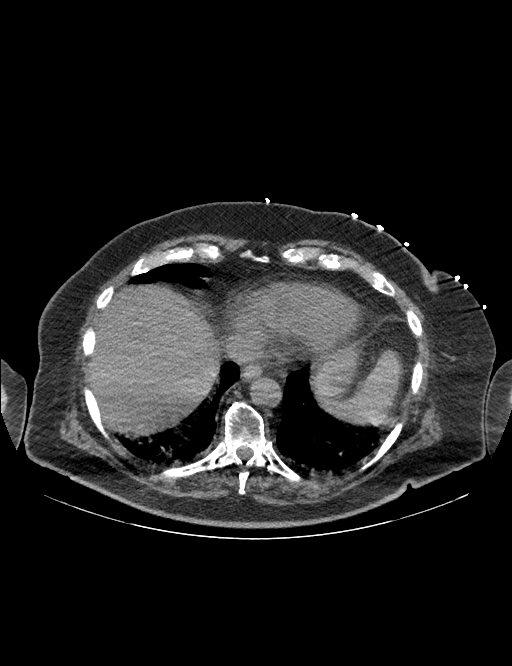
[im 42/125  soft-tissue]
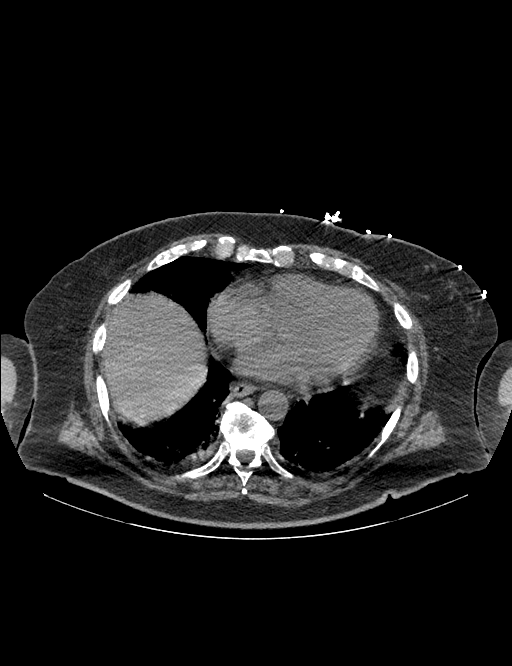
[im 58/125  soft-tissue]
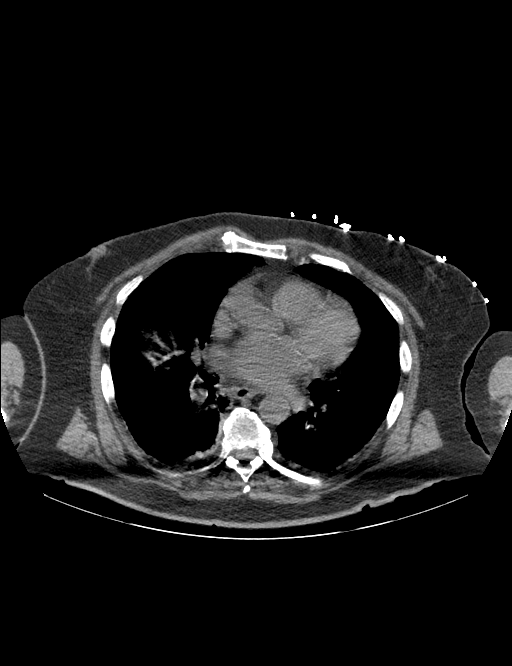
[im 67/125  soft-tissue]
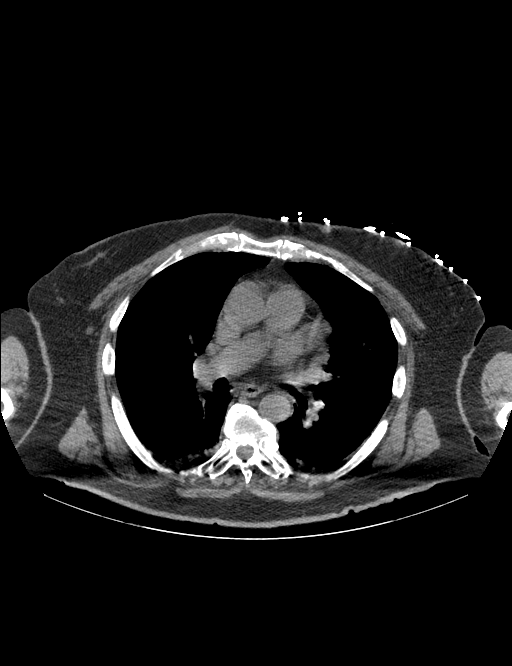
[im 83/125  soft-tissue]
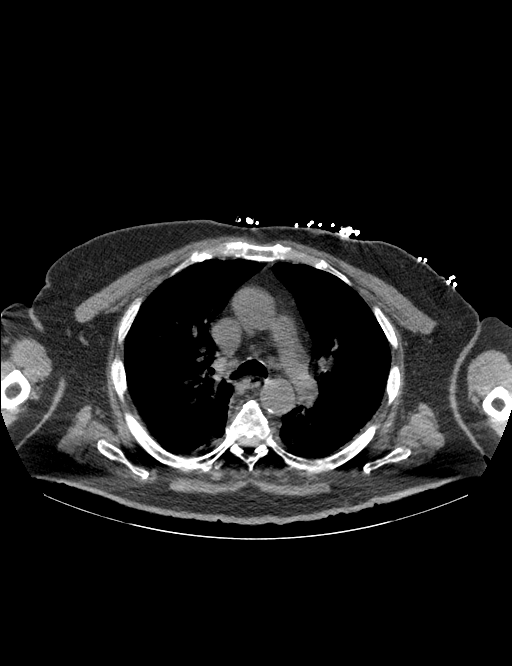
[im 91/125  lung]
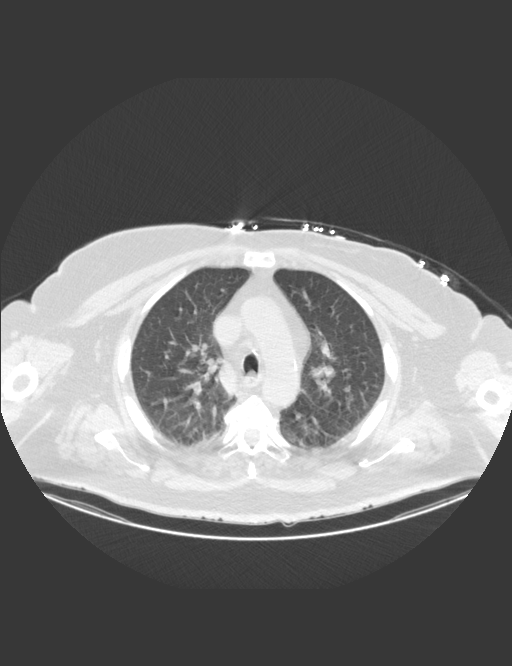
[im 100/125  soft-tissue]
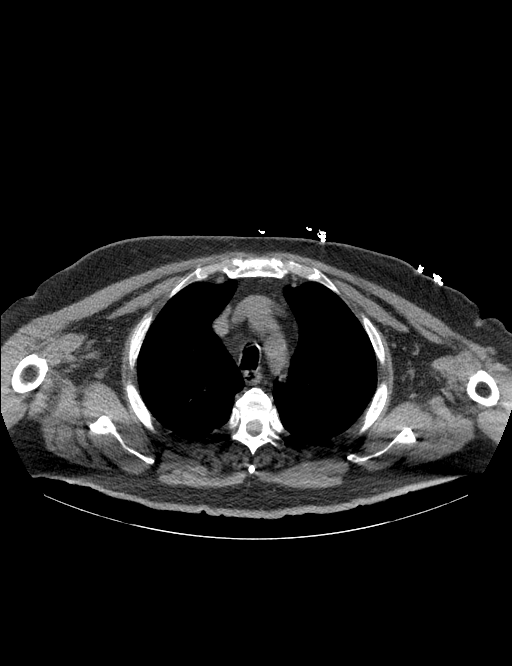
[im 100/125  lung]
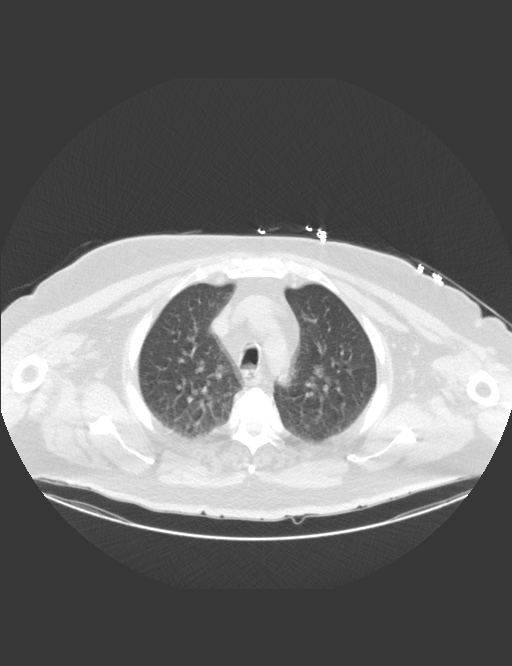
[im 108/125  lung]
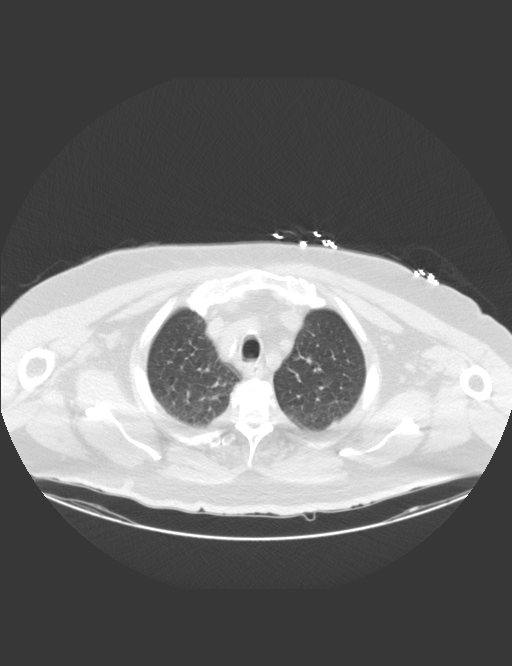
[im 116/125  soft-tissue]
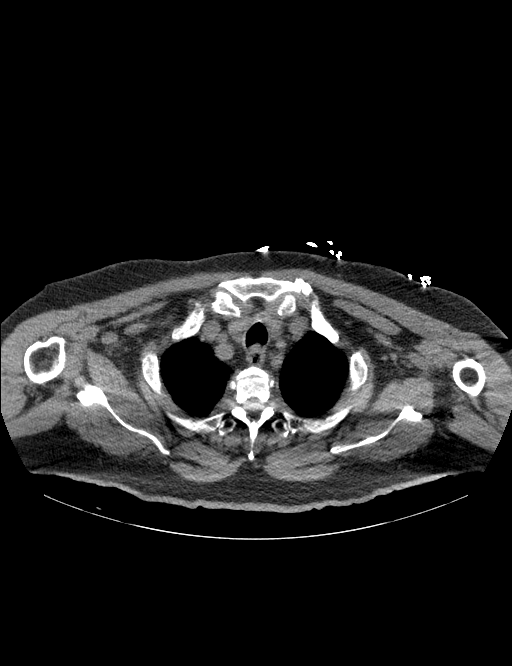
[im 116/125  lung]
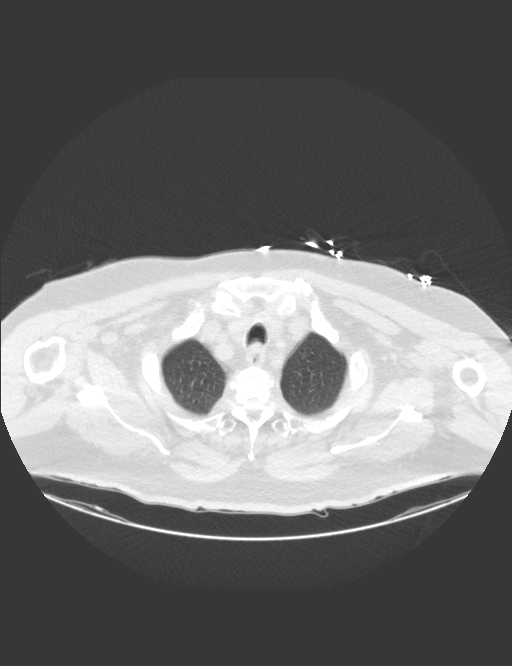

[10 of 46 positions shown; findings below may reference images not displayed]

FINDINGS: CT CHEST FINDINGS

Cardiovascular: Scattered coronary artery calcifications are seen.
Calcification is noted at the aortic valve. Mild calcification is
noted at the great vessels. The thoracic aorta is grossly
unremarkable in appearance. The heart is normal in size.

Mediastinum/Nodes: The mediastinum is otherwise grossly
unremarkable. No mediastinal lymphadenopathy is seen. No pericardial
effusion is identified. The visualized portions of the thyroid gland
are unremarkable. No axillary lymphadenopathy is seen.

Lungs/Pleura: Trace bilateral pleural fluid is noted, with patchy
bibasilar opacities likely reflecting atelectasis. No pneumothorax
is identified. No dominant mass is seen. Evaluation is suboptimal
due to motion artifact.

Musculoskeletal: No acute osseous abnormalities are identified. The
chest wall is grossly unremarkable in appearance.

CT ABDOMEN PELVIS FINDINGS

Hepatobiliary: The liver is unremarkable in appearance. The
gallbladder is unremarkable in appearance. The common bile duct
remains normal in caliber.

Pancreas: The pancreas is within normal limits.

Spleen: The spleen is unremarkable in appearance.

Adrenals/Urinary Tract: The adrenal glands are unremarkable in
appearance.

Nonspecific perinephric stranding is noted bilaterally. Mild
bilateral renal atrophy is noted. There is no evidence of
hydronephrosis. No renal or ureteral stones are seen. Stranding
about the left renal pelvis could reflect mild left-sided
pyelonephritis.

Stomach/Bowel: The stomach is unremarkable in appearance. The small
bowel is within normal limits. The appendix is not visualized; there
is no evidence for appendicitis. Minimal diverticulosis is noted
along the proximal sigmoid colon, without evidence of
diverticulitis.

Vascular/Lymphatic: Scattered calcification is seen along the
abdominal aorta and its branches. The abdominal aorta is otherwise
grossly unremarkable. The inferior vena cava is grossly
unremarkable. Visualized retroperitoneal nodes remain borderline
normal in size. No pelvic sidewall lymphadenopathy is identified.

Reproductive: The prostate is enlarged, measuring 5.4 cm in
transverse dimension, with impression on the base of the bladder.
The bladder is mildly distended and grossly unremarkable.

Other: No additional soft tissue abnormalities are seen.

Musculoskeletal: No acute osseous abnormalities are identified. The
visualized musculature is unremarkable in appearance.
IMPRESSION: 1. Stranding about the left renal pelvis could reflect mild
left-sided pyelonephritis. Would correlate with the patient's
symptoms.
2. Trace bilateral pleural fluid, with patchy bibasilar airspace
opacities likely reflecting atelectasis. Evaluation of the lung
fields is suboptimal due to motion artifact.
3. Scattered coronary artery calcifications seen. Calcification
noted at the aortic valve.
4. Mild bilateral renal atrophy noted.
5. Minimal diverticulosis along the proximal sigmoid colon, without
evidence of diverticulitis.
6. Scattered aortic atherosclerosis noted.
7. Enlarged prostate noted, with impression on the base of the
bladder. Would correlate with PSA.
